# Patient Record
Sex: Female | Born: 1985 | Race: White | Hispanic: No | Marital: Married | Smoking: Never smoker
Health system: Southern US, Community
[De-identification: ages and names within clinical notes are randomized; demographics above are authoritative.]

## PROBLEM LIST (undated history)

## (undated) DIAGNOSIS — Z9109 Other allergy status, other than to drugs and biological substances: Secondary | ICD-10-CM

## (undated) DIAGNOSIS — F329 Major depressive disorder, single episode, unspecified: Secondary | ICD-10-CM

## (undated) DIAGNOSIS — R21 Rash and other nonspecific skin eruption: Secondary | ICD-10-CM

## (undated) DIAGNOSIS — M419 Scoliosis, unspecified: Secondary | ICD-10-CM

## (undated) DIAGNOSIS — G894 Chronic pain syndrome: Secondary | ICD-10-CM

## (undated) DIAGNOSIS — R002 Palpitations: Secondary | ICD-10-CM

## (undated) DIAGNOSIS — F410 Panic disorder [episodic paroxysmal anxiety] without agoraphobia: Secondary | ICD-10-CM

## (undated) DIAGNOSIS — F4310 Post-traumatic stress disorder, unspecified: Secondary | ICD-10-CM

## (undated) DIAGNOSIS — E349 Endocrine disorder, unspecified: Secondary | ICD-10-CM

## (undated) DIAGNOSIS — R5383 Other fatigue: Secondary | ICD-10-CM

## (undated) DIAGNOSIS — I491 Atrial premature depolarization: Secondary | ICD-10-CM

## (undated) DIAGNOSIS — F419 Anxiety disorder, unspecified: Secondary | ICD-10-CM

## (undated) DIAGNOSIS — E282 Polycystic ovarian syndrome: Secondary | ICD-10-CM

## (undated) DIAGNOSIS — F32A Depression, unspecified: Secondary | ICD-10-CM

## (undated) DIAGNOSIS — R1314 Dysphagia, pharyngoesophageal phase: Secondary | ICD-10-CM

## (undated) DIAGNOSIS — J45909 Unspecified asthma, uncomplicated: Secondary | ICD-10-CM

## (undated) DIAGNOSIS — F909 Attention-deficit hyperactivity disorder, unspecified type: Secondary | ICD-10-CM

## (undated) DIAGNOSIS — R42 Dizziness and giddiness: Secondary | ICD-10-CM

## (undated) HISTORY — DX: Anxiety disorder, unspecified: F41.9

## (undated) HISTORY — DX: Scoliosis, unspecified: M41.9

## (undated) HISTORY — DX: Palpitations: R00.2

## (undated) HISTORY — DX: Attention-deficit hyperactivity disorder, unspecified type: F90.9

## (undated) HISTORY — DX: Polycystic ovarian syndrome: E28.2

## (undated) HISTORY — DX: Dizziness and giddiness: R42

## (undated) HISTORY — DX: Unspecified asthma, uncomplicated: J45.909

## (undated) HISTORY — PX: TONSILLECTOMY AND ADENOIDECTOMY: SHX28

## (undated) HISTORY — DX: Rash and other nonspecific skin eruption: R21

## (undated) HISTORY — DX: Depression, unspecified: F32.A

## (undated) HISTORY — DX: Atrial premature depolarization: I49.1

## (undated) HISTORY — DX: Dysphagia, pharyngoesophageal phase: R13.14

## (undated) HISTORY — DX: Endocrine disorder, unspecified: E34.9

## (undated) HISTORY — DX: Post-traumatic stress disorder, unspecified: F43.10

## (undated) HISTORY — DX: Panic disorder (episodic paroxysmal anxiety): F41.0

## (undated) HISTORY — DX: Other fatigue: R53.83

## (undated) HISTORY — DX: Other allergy status, other than to drugs and biological substances: Z91.09

## (undated) HISTORY — DX: Chronic pain syndrome: G89.4

## (undated) HISTORY — DX: Major depressive disorder, single episode, unspecified: F32.9

---

## 2005-03-05 ENCOUNTER — Emergency Department (HOSPITAL_COMMUNITY): Admission: EM | Admit: 2005-03-05 | Discharge: 2005-03-05 | Payer: Self-pay | Admitting: Emergency Medicine

## 2007-03-14 ENCOUNTER — Ambulatory Visit: Payer: Self-pay | Admitting: Gastroenterology

## 2007-03-14 LAB — CONVERTED CEMR LAB
AST: 12 units/L (ref 0–37)
Albumin: 3.8 g/dL (ref 3.5–5.2)
BUN: 13 mg/dL (ref 6–23)
Basophils Relative: 0.7 % (ref 0.0–1.0)
Bilirubin, Direct: 0.1 mg/dL (ref 0.0–0.3)
CO2: 27 meq/L (ref 19–32)
Chloride: 109 meq/L (ref 96–112)
Creatinine, Ser: 0.8 mg/dL (ref 0.4–1.2)
Eosinophils Absolute: 0.4 10*3/uL (ref 0.0–0.6)
GFR calc Af Amer: 116 mL/min
GFR calc non Af Amer: 96 mL/min
Glucose, Bld: 89 mg/dL (ref 70–99)
HCT: 36.7 % (ref 36.0–46.0)
Lipase: 35 units/L (ref 11.0–59.0)
Neutro Abs: 3.2 10*3/uL (ref 1.4–7.7)
Potassium: 4.1 meq/L (ref 3.5–5.1)
RDW: 11.2 % — ABNORMAL LOW (ref 11.5–14.6)
Saturation Ratios: 15.1 % — ABNORMAL LOW (ref 20.0–50.0)
Sed Rate: 10 mm/hr (ref 0–25)
Sodium: 141 meq/L (ref 135–145)
TSH: 1.6 microintl units/mL (ref 0.35–5.50)
Total Protein: 7 g/dL (ref 6.0–8.3)
Transferrin: 308 mg/dL (ref >=212.0)
WBC: 7.7 10*3/uL (ref 4.5–10.5)

## 2007-03-24 ENCOUNTER — Ambulatory Visit (HOSPITAL_COMMUNITY): Admission: RE | Admit: 2007-03-24 | Discharge: 2007-03-24 | Payer: Self-pay | Admitting: Gastroenterology

## 2007-03-27 ENCOUNTER — Encounter: Payer: Self-pay | Admitting: Gastroenterology

## 2007-03-27 ENCOUNTER — Ambulatory Visit: Payer: Self-pay | Admitting: Gastroenterology

## 2007-04-27 ENCOUNTER — Ambulatory Visit: Payer: Self-pay | Admitting: Gastroenterology

## 2007-09-28 DIAGNOSIS — K219 Gastro-esophageal reflux disease without esophagitis: Secondary | ICD-10-CM

## 2007-09-28 DIAGNOSIS — K589 Irritable bowel syndrome without diarrhea: Secondary | ICD-10-CM

## 2007-09-28 DIAGNOSIS — J45909 Unspecified asthma, uncomplicated: Secondary | ICD-10-CM | POA: Insufficient documentation

## 2007-09-28 DIAGNOSIS — K449 Diaphragmatic hernia without obstruction or gangrene: Secondary | ICD-10-CM

## 2007-09-28 HISTORY — DX: Gastro-esophageal reflux disease without esophagitis: K21.9

## 2007-09-28 HISTORY — DX: Irritable bowel syndrome, unspecified: K58.9

## 2007-09-28 HISTORY — DX: Diaphragmatic hernia without obstruction or gangrene: K44.9

## 2008-04-19 IMAGING — US US ABDOMEN COMPLETE
1 series · 14 of 25 positions shown · non-contrast
Comparison: none

Clinic done: Abdominal pain

Ultrasound abdomen:
No previous for comparison.  Complete abdominal ultrasound examination was
performed including evaluation of the liver, gallbladder, bile ducts, pancreas,
kidneys, spleen, IVC, and abdominal aorta.
There is no evidence of gallstones or biliary ductal dilatation.  The liver is
within normal limits in echogenicity, and no focal liver lesions are seen.  The
visualized portions of the IVC and pancreas are unremarkable.
There is no evidence of splenomegaly.  The kidneys are unremarkable, and there
is no evidence of hydronephrosis.  The abdominal aorta is non-dilated.

[Series 1: unknown · 0.22mm/px · 14 of 57 slices shown]
[im 1/57]
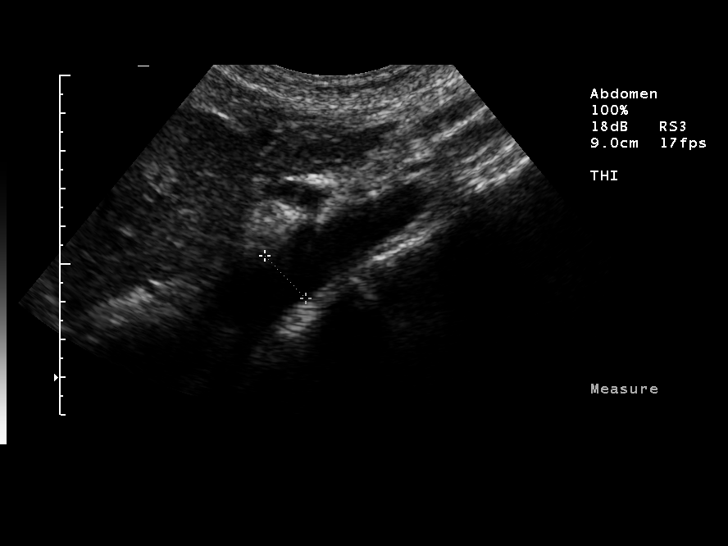
[im 5/57]
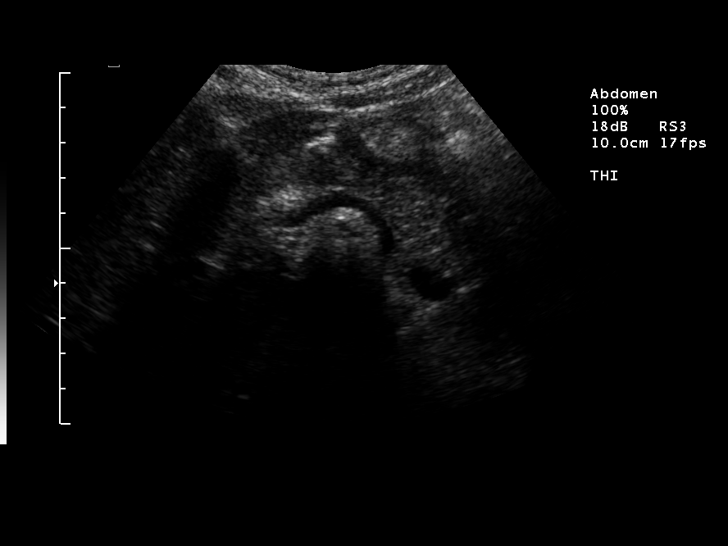
[im 10/57]
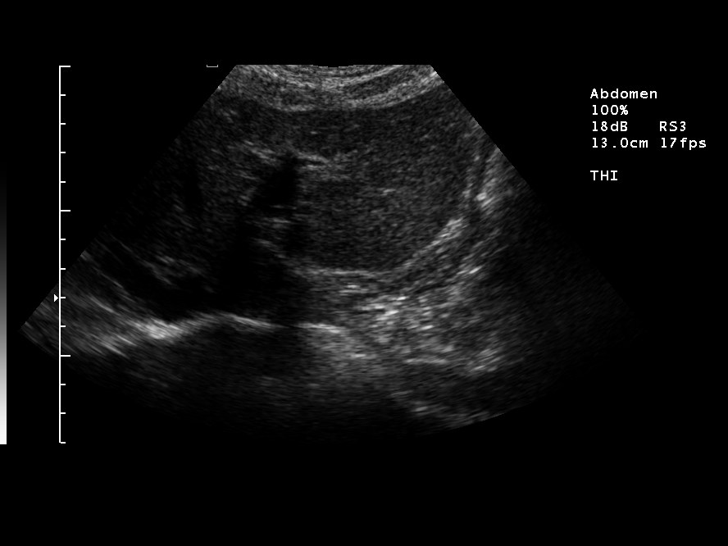
[im 15/57]
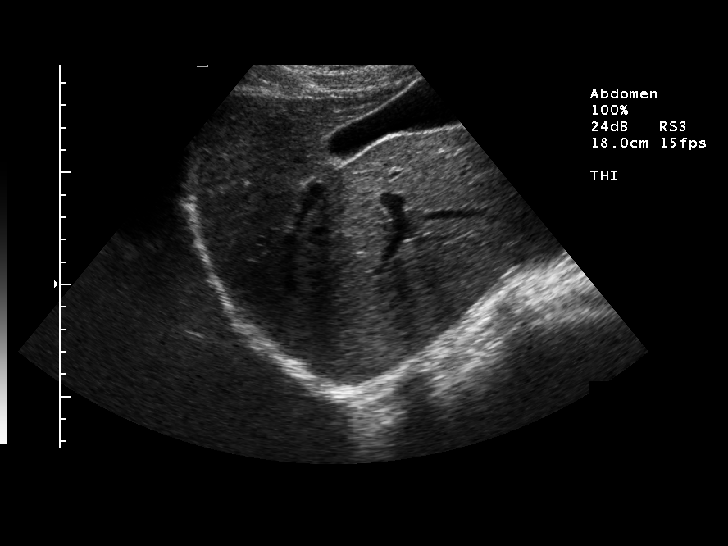
[im 19/57]
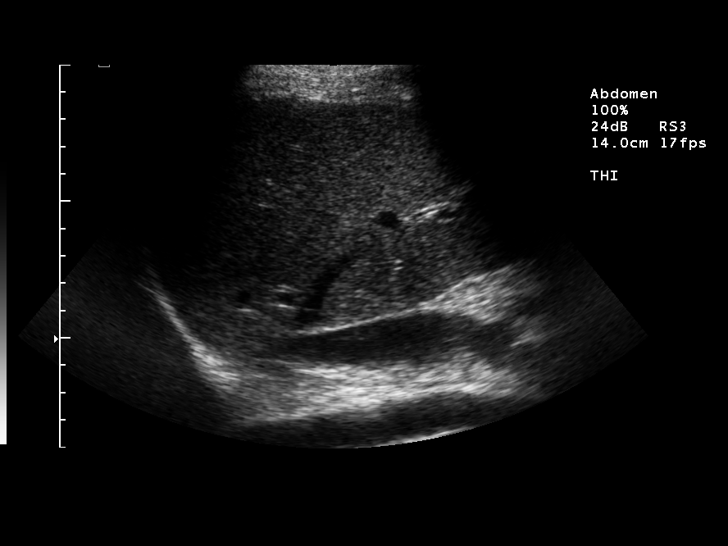
[im 22/57]
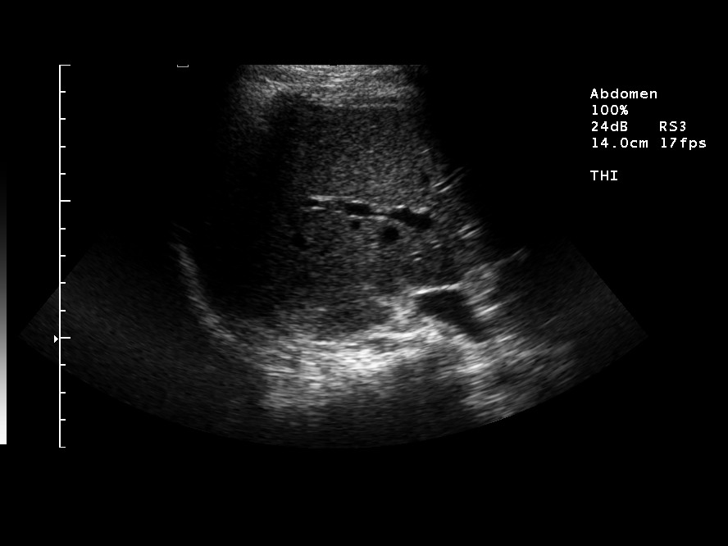
[im 26/57]
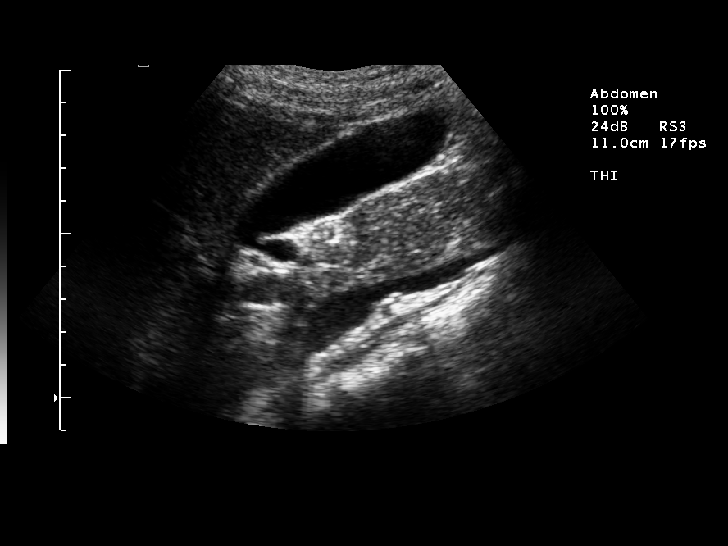
[im 31/57]
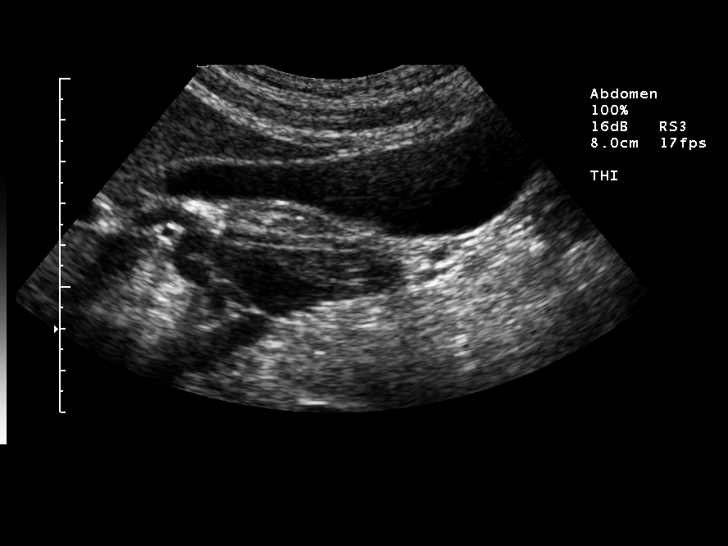
[im 36/57]
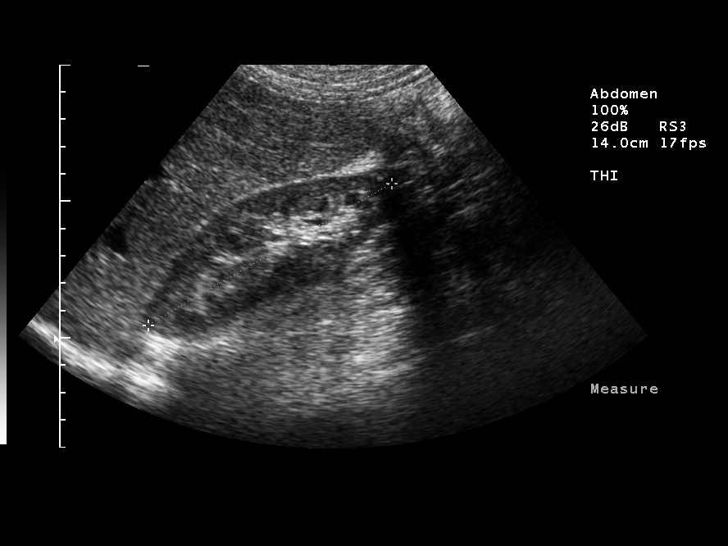
[im 38/57]
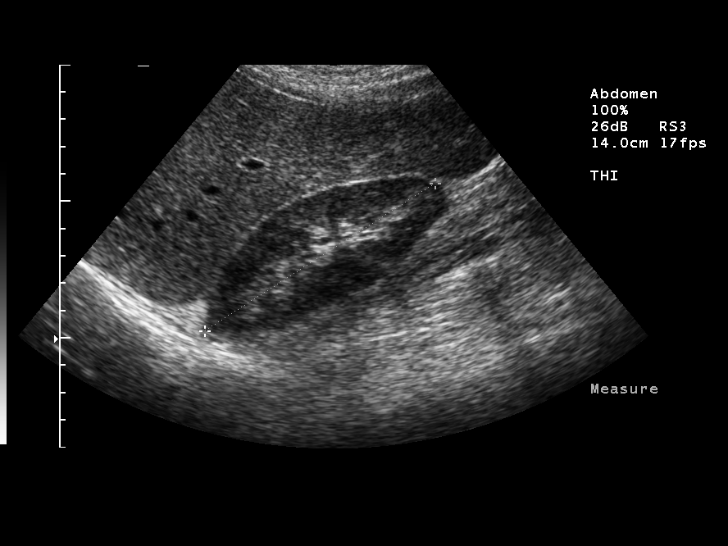
[im 43/57]
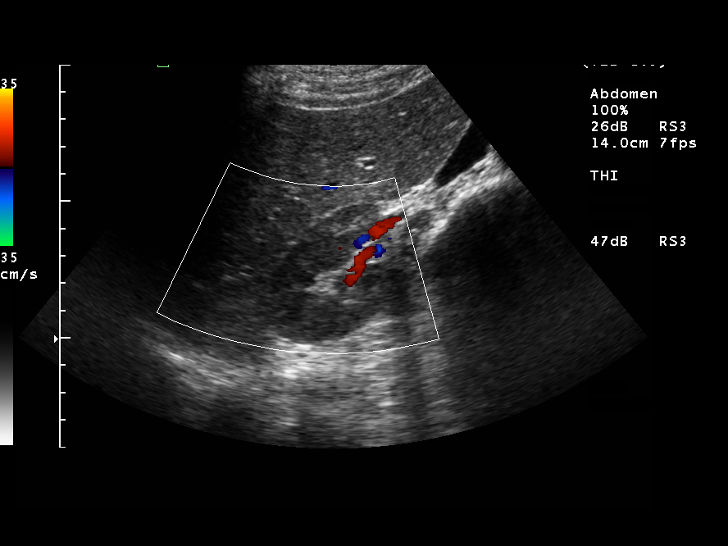
[im 47/57]
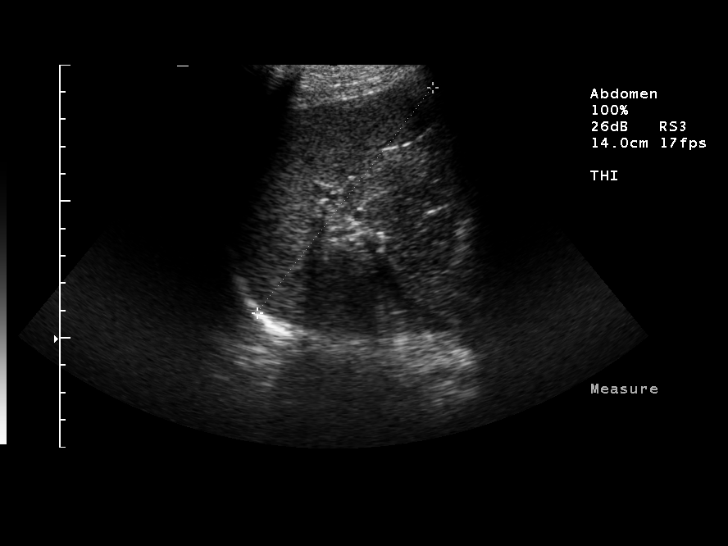
[im 52/57]
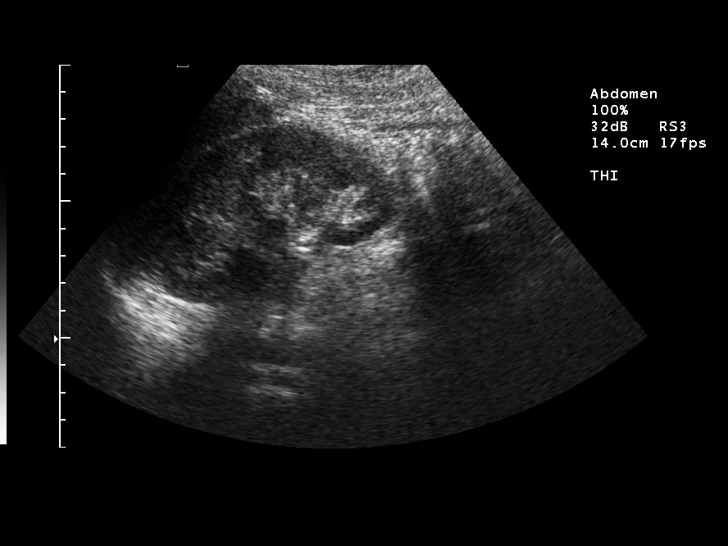
[im 57/57]
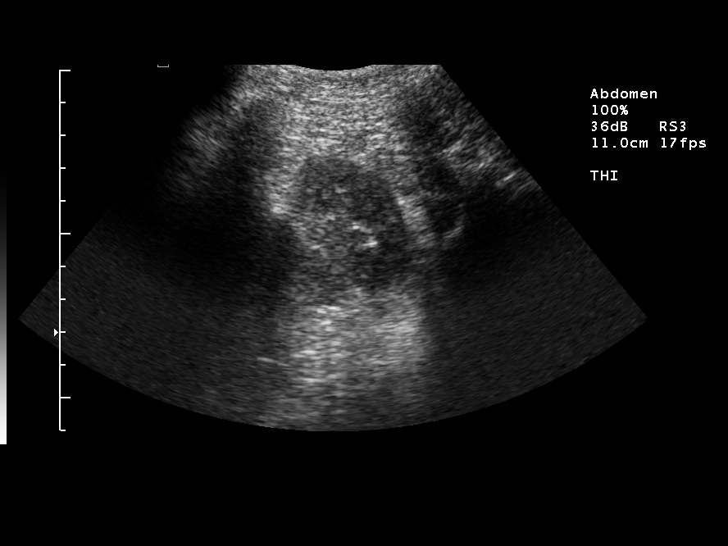

[14 of 25 positions shown; findings below may reference images not displayed]

IMPRESSION: 1.  Negative abdominal ultrasound.

## 2009-07-19 HISTORY — PX: WISDOM TOOTH EXTRACTION: SHX21

## 2010-12-01 NOTE — Assessment & Plan Note (Signed)
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   I have no previous records for review.  The patient apparently I have  had problems since age 86.  She had problems with hyperacidity and acid  reflux and was on frequent antacids until she turned 17, when she was  apparently placed on Nexium 1-2x a day by her physician.  She takes this  very erratically irregularly.  She continues to have burning epigastric  and substernal chest pain with acid reflux symptoms but no dysphasia.  She wakes up every morning and has emesis  of bilious material without  food products.  She has frequent diarrhea with abdominal cramping; and  said she had a burgundy-colored stool three weeks ago.  Her appetite is  good and she has had no weight loss.  She denies associated skin rashes,  joint pains, oral stomatitis, or visual difficulties.  She takes two  Advil a week, but denies NSAID abuse or abuse of alcohol or cigarettes.  She has never had  GI evaluations in the past.  She has traveled  extensively, but not to [place] countries.  She denies any specific  food intolerances, specifically, lactose and does not abuse sorbitol or  fructose.   PAST MEDICAL HISTORY:  Otherwise remarkable for asthmatic bronchitis  with a possible relationship to her GERD.  She uses PRN inhalers.  She  otherwise has no history of medical problems.   FAMILY HISTORY:  Remarkable for irritable bowel syndrome allegedly in  her father.   MEDICATIONS:  1. NuvaRing  2. Nexium twice a day.  3. Albuterol  inhaler.  4. Over-the-counter allergy medications.   ALLERGIES:  She denies drug allergies.   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  She  does not smoke, but uses ethanol socially.   REVIEW OF SYSTEMS:  Noncontributory without symptoms of Raynaud's  phenomenon, or other symptoms of collagen vascular disease.  She denies  cardiovascular, pulmonary, genitourinary, or gynecological problems.  She had her last menstrual period two days ago.   PHYSICAL EXAM:  She is an attractive, healthy, white female appearing  her stated age in no distress.  She is 5 feet 2 inches tall and weighs 132 pounds.  Blood pressures  124/64 and pulse was 60 and regular.  I could not appreciate stigmata of  chronic liver disease.  There was no thyromegaly or lymphadenopathy.  Chest was clear without wheezes or rhonchi.  She was in a regular rhythm without murmurs, gallops, or rubs.  I could not appreciate any hepatosplenomegaly, abdominal masses,  tenderness, or distention.  Bowel sounds were normal.  There was not a  succession splash in the epigastric area.  Mental  status was clear.  Peripheral extremities were unremarkable.  Inspection of the rectum was normal as was rectal exam without masses or  tenderness.  There was soft stool present.  It was guaiac-negative.   ASSESSMENT:  1. Chronic acid reflux disease with possible element of gastroparesis.  2. Rule out cholelithiasis.  3. Diarrhea, predominant irritable bowel syndrome rule out intestinal      parasites.   RECOMMENDATIONS:  1. Outpatient endoscopy and ultrasonography.  2. Screening laboratory parameters including celiac panel, amylase,      lipase, thyroid function tests, CBC, etcetera.  3. Reflux regimen explained to the patient.  4. Check stool for ova and parasites.  5. Ask the patient to take her Nexium 30 minutes before breakfast and      supper with Reglan 10 mg at bedtime.  6. Appropriate follow up after  her workup and procedures and dp reflux      regimen.     Vania Rea. Ann Motto, MD, Caleen Essex, FAGA  Electronically Signed    DRP/MedQ  DD: 03/14/2007  DT: 03/14/2007  Job #: [id number]

## 2010-12-01 NOTE — Assessment & Plan Note (Signed)
[facility]                         GASTROENTEROLOGY OFFICE NOTE   Ann, Christensen                   MRN:          [id number]  DATE:04/27/2007                            DOB:          [birth year]    Ann Christensen is asymptomatic on Nexium 40 mg twice a day along with the  Reglan 10 mg at bedtime.  She has had years of acid reflux and  regurgitation and is doing the best she has in many years.  Her  endoscopy on March 27, 2007, did show a hiatal hernia measuring 5 cm  in length with some erosive esophagitis.  There was no evidence of  Barrett's mucosa on esophageal biopsies.  CLOtest for H. pylori also was  negative.   I reviewed a reflux regime with Ann Christensen, and we will see her again in  3 months' time.  I reviewed the negative ultrasound exam that she had on  March 24, 2007, and also her multiple normal laboratory parameters.     Ann Christensen. Ann Motto, MD, Caleen Essex, FAGA  Electronically Signed    DRP/MedQ  DD: 04/27/2007  DT: 04/27/2007  Job #: [id number]

## 2014-12-23 LAB — HIV ANTIBODY (ROUTINE TESTING W REFLEX): HIV 1&2 Ab, 4th Generation: NONREACTIVE

## 2014-12-23 LAB — HM PAP SMEAR

## 2016-06-04 LAB — BASIC METABOLIC PANEL: Glucose: 95 mg/dL

## 2016-06-04 LAB — LIPID PANEL
Cholesterol: 168 mg/dL (ref 0–200)
HDL: 57 mg/dL (ref 35–70)
Triglycerides: 117 mg/dL (ref 40–160)

## 2016-08-09 ENCOUNTER — Encounter: Payer: Self-pay | Admitting: Adult Health

## 2016-08-09 ENCOUNTER — Ambulatory Visit (INDEPENDENT_AMBULATORY_CARE_PROVIDER_SITE_OTHER): Payer: BLUE CROSS/BLUE SHIELD | Admitting: Adult Health

## 2016-08-09 VITALS — BP 126/85 | HR 98 | Ht 65.0 in | Wt 161.5 lb

## 2016-08-09 DIAGNOSIS — L700 Acne vulgaris: Secondary | ICD-10-CM

## 2016-08-09 DIAGNOSIS — F909 Attention-deficit hyperactivity disorder, unspecified type: Secondary | ICD-10-CM | POA: Diagnosis not present

## 2016-08-09 DIAGNOSIS — G4700 Insomnia, unspecified: Secondary | ICD-10-CM

## 2016-08-09 DIAGNOSIS — M545 Low back pain: Secondary | ICD-10-CM

## 2016-08-09 DIAGNOSIS — F411 Generalized anxiety disorder: Secondary | ICD-10-CM

## 2016-08-09 DIAGNOSIS — G8929 Other chronic pain: Secondary | ICD-10-CM

## 2016-08-09 MED ORDER — SPIRONOLACTONE 100 MG PO TABS: 100.0000 mg | ORAL_TABLET | Freq: Every day | ORAL | 1 refills | Status: DC

## 2016-08-09 MED ORDER — BUPROPION HCL ER (XL) 300 MG PO TB24: 300.0000 mg | ORAL_TABLET | Freq: Every day | ORAL | 1 refills | Status: DC

## 2016-08-09 MED ORDER — AMPHETAMINE-DEXTROAMPHETAMINE 15 MG PO TABS: 15.0000 mg | ORAL_TABLET | Freq: Every day | ORAL | 0 refills | Status: DC

## 2016-08-09 MED ORDER — CLONIDINE HCL 0.1 MG PO TABS: 0.1000 mg | ORAL_TABLET | Freq: Every day | ORAL | 1 refills | Status: DC

## 2016-08-09 MED ORDER — AMPHETAMINE-DEXTROAMPHET ER 10 MG PO CP24: 10.0000 mg | ORAL_CAPSULE | Freq: Every day | ORAL | 0 refills | Status: DC

## 2016-08-09 MED ORDER — CYCLOBENZAPRINE HCL 10 MG PO TABS: 10.0000 mg | ORAL_TABLET | Freq: Three times a day (TID) | ORAL | 2 refills | Status: DC | PRN

## 2016-08-09 NOTE — Progress Notes (Signed)
   Subjective:    Patient ID: Ann Christensen, female    DOB: 16-Jan-1986, 31 y.o.   MRN: [id number]  HPI:  Ann Christensen presents to establish as new patient.  I have provided primary care to Ann Christensen for > 3 years at [facility].  Chronic medical conditions are well controlled with medication and she denies SE or non-compliance.  She continues monthly mental health visits and denies thoughts of harming herself or others.  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     Review of Systems  Constitutional: Negative for activity change, appetite change, diaphoresis, fatigue, fever and unexpected weight change.  HENT: Negative for congestion, sore throat and trouble swallowing.   Eyes: Negative for visual disturbance.  Respiratory: Negative for cough, chest tightness, shortness of breath and wheezing.   Cardiovascular: Negative for chest pain, palpitations and leg swelling.  Gastrointestinal: Negative for abdominal distention, constipation, diarrhea and nausea.  Endocrine: Negative for cold intolerance, heat intolerance, polydipsia, polyphagia and polyuria.  Genitourinary: Negative for difficulty urinating.  Musculoskeletal: Positive for back pain. Negative for myalgias.       Intermittent low back pain that is treated with Rx, rest, and heating pad.  Neurological: Negative for dizziness, speech difficulty and weakness.  Psychiatric/Behavioral: Positive for behavioral problems and sleep disturbance. Negative for agitation, confusion, hallucinations and suicidal ideas. The patient is hyperactive.       Objective:   Physical Exam  Constitutional: She is oriented to person, place, and time. She appears well-developed and well-nourished. No distress.  HENT:  Head: Normocephalic and atraumatic.  Right Ear: External ear normal.  Left Ear: External ear normal.  Eyes: Conjunctivae and EOM are normal. Pupils are equal, round, and  reactive to light.  Neck: Normal range of motion. Neck supple. No tracheal deviation present. No thyromegaly present.  Cardiovascular: Normal rate, regular rhythm, normal heart sounds and intact distal pulses.   Pulmonary/Chest: Effort normal and breath sounds normal. No respiratory distress. She has no wheezes. She has no rales. She exhibits no tenderness.  Abdominal: Soft. Bowel sounds are normal.  Musculoskeletal: Normal range of motion. She exhibits no edema or deformity.  Neurological: She is alert and oriented to person, place, and time. She has normal reflexes.  Skin: Skin is warm and dry. She is not diaphoretic.  Psychiatric: She has a normal mood and affect. Her behavior is normal. Judgment and thought content normal.  Nursing note and vitals reviewed.         Assessment & Plan:  1.  ADHD, 2 GAD-continue with medications and regular CBT. 2.  Insomnia-continue with current medications and practice good sleep hygiene. 4.  Acne-continue with medication and daily face washing. 5. Lumbago- Continue with medication and regular stretching.  Use good body mechanics when lifting, etc.  Rx's provided, take as directed. Continue at least monthly mental health encounters. Continue regular exercise and health eating. RTC in 4 weeks or sooner if needed. All questions/concerns answered.  Jariel Drost d. Beverlyn RouxBess, NP-C

## 2016-08-09 NOTE — Patient Instructions (Signed)
Seasonal Affective Disorder Introduction Seasonal affective disorder (SAD) is a form of depression. It is when you feel sad, down, or blue at specific times of the year. The most common time of year for this is late fall and winter, when the days are shorter and most people spend less time outdoors. This is why SAD is also known as the "winter blues." SAD occurs less commonly in the spring or summer. SAD can vary in severity and can interfere with work, school, relationships, and normal daily activities. What increases the risk? This condition is more common in:  [place] women.  People who have a history of clinical depression or bipolar disorder.  People who live far [place] or far [place] of the equator. What are the signs or symptoms? Symptoms of this condition include:  Depressed mood, such as:  Feeling sad, down, blue, or teary.  Having crying spells.  Irritability.  Trouble sleeping or sleeping more than usual.  Loss of interest in activities that you usually enjoy.  Feelings of guilt or worthlessness.  Restlessness or loss of energy.  Difficulty concentrating, remembering, or making decisions.  Significant change in appetite or weight.  Recurrent wishes for death, recurrent thoughts of self-harm, or an attempt at suicide. How is this diagnosed? Diagnosis of this condition is usually made through an assessment by your health care provider. You will be asked about your moods, thoughts, and behaviors. You will also be asked about your medical history, any major life changes, medicines, and substance use. A physical exam and lab work may be done to make sure there is no other cause for your depression. You may be referred to a mental health specialist for further evaluation. How is this treated? Treatment for this condition may include:  Light therapy. Light therapy involves sitting in front of a light source for 15-30 minutes every day. It is thought to work by increasing the  duration of daylight that is sensed by the brain.  Antidepressant medicine.  Cognitive behavioral therapy (CBT).CBT is a form of talk therapy that helps to identify and change negative thoughts that are associated with SAD. Follow these instructions at home:  Take over-the-counter and prescription medicines only as told by your healthcare provider. This is important.  Check with your health care provider before you start taking any new prescriptions, over-the-counter medicines, herbs, or supplements.  Keep all follow-up visits as told by your health care provider.This is important.  Maintain a healthy lifestyle.  Eat healthy foods.  Get plenty of sleep.  Exercise regularly.  Do not drink alcohol.  Do not use recreational drugs.  Make your home and work environment as sunny or bright as possible. Open blinds and spend as much time as possible outside. Contact a health care provider if:  Your medicines do not seem to be helping.  Your symptoms do not improve or they get worse.  You have trouble taking care of yourself.  You experience side effects of medicines, such as changes in sleep patterns, dizziness, drowsiness, weight gain, restlessness, movement changes, or tremors. Get help right away if:  You have serious thoughts about hurting yourself or others.  You experience serious side effects of medicine, such as:  Swelling of your face, lips, tongue, or throat.  Fever, confusion, muscle spasms, or seizures. This information is not intended to replace advice given to you by your health care provider. Make sure you discuss any questions you have with your health care provider. Document Released: 03/30/2001 Document Revised: 12/11/2015  Document Reviewed: 07/09/2014  2017 Elsevier  Generalized Anxiety Disorder Generalized anxiety disorder (GAD) is a mental disorder. It interferes with life functions, including relationships, work, and school. GAD is different from  normal anxiety, which everyone experiences at some point in their lives in response to specific life events and activities. Normal anxiety actually helps us prepare for and get through these life events and activities. Normal anxiety goes away after the event or activity is over.  GAD causes anxiety that is not necessarily related to specific events or activities. It also causes excess anxiety in proportion to specific events or activities. The anxiety associated with GAD is also difficult to control. GAD can vary from mild to severe. People with severe GAD can have intense waves of anxiety with physical symptoms (panic attacks).  SYMPTOMS The anxiety and worry associated with GAD are difficult to control. This anxiety and worry are related to many life events and activities and also occur more days than not for 6 months or longer. People with GAD also have three or more of the following symptoms (one or more in children):  Restlessness.   Fatigue.  Difficulty concentrating.   Irritability.  Muscle tension.  Difficulty sleeping or unsatisfying sleep. DIAGNOSIS GAD is diagnosed through an assessment by your health care provider. Your health care provider will ask you questions aboutyour mood,physical symptoms, and events in your life. Your health care provider may ask you about your medical history and use of alcohol or drugs, including prescription medicines. Your health care provider may also do a physical exam and blood tests. Certain medical conditions and the use of certain substances can cause symptoms similar to those associated with GAD. Your health care provider may refer you to a mental health specialist for further evaluation. TREATMENT The following therapies are usually used to treat GAD:   Medication. Antidepressant medication usually is prescribed for long-term daily control. Antianxiety medicines may be added in severe cases, especially when panic attacks occur.   Talk  therapy (psychotherapy). Certain types of talk therapy can be helpful in treating GAD by providing support, education, and guidance. A form of talk therapy called cognitive behavioral therapy can teach you healthy ways to think about and react to daily life events and activities.  Stress managementtechniques. These include yoga, meditation, and exercise and can be very helpful when they are practiced regularly. A mental health specialist can help determine which treatment is best for you. Some people see improvement with one therapy. However, other people require a combination of therapies. This information is not intended to replace advice given to you by your health care provider. Make sure you discuss any questions you have with your health care provider. Document Released: 10/30/2012 Document Revised: 07/26/2014 Document Reviewed: 10/30/2012 Elsevier Interactive Patient Education  2017 [employer]  Please return for monthly follow-ups and as needed. Continue with mental health. Continue with healthy eating and regular exercise.

## 2016-09-07 ENCOUNTER — Other Ambulatory Visit: Payer: Self-pay | Admitting: Adult Health

## 2016-09-09 ENCOUNTER — Encounter: Payer: Self-pay | Admitting: Adult Health

## 2016-09-09 ENCOUNTER — Ambulatory Visit (INDEPENDENT_AMBULATORY_CARE_PROVIDER_SITE_OTHER): Payer: BLUE CROSS/BLUE SHIELD | Admitting: Adult Health

## 2016-09-09 VITALS — BP 123/87 | HR 79 | Ht 65.0 in | Wt 159.0 lb

## 2016-09-09 DIAGNOSIS — Z79899 Other long term (current) drug therapy: Secondary | ICD-10-CM

## 2016-09-09 DIAGNOSIS — G4700 Insomnia, unspecified: Secondary | ICD-10-CM | POA: Diagnosis not present

## 2016-09-09 DIAGNOSIS — F419 Anxiety disorder, unspecified: Secondary | ICD-10-CM

## 2016-09-09 DIAGNOSIS — F909 Attention-deficit hyperactivity disorder, unspecified type: Secondary | ICD-10-CM | POA: Diagnosis not present

## 2016-09-09 DIAGNOSIS — F339 Major depressive disorder, recurrent, unspecified: Secondary | ICD-10-CM

## 2016-09-09 HISTORY — DX: Major depressive disorder, recurrent, unspecified: F33.9

## 2016-09-09 HISTORY — DX: Insomnia, unspecified: G47.00

## 2016-09-09 HISTORY — DX: Attention-deficit hyperactivity disorder, unspecified type: F90.9

## 2016-09-09 MED ORDER — AMPHETAMINE-DEXTROAMPHET ER 10 MG PO CP24: 10.0000 mg | ORAL_CAPSULE | Freq: Every day | ORAL | 0 refills | Status: DC

## 2016-09-09 MED ORDER — AMPHETAMINE-DEXTROAMPHETAMINE 15 MG PO TABS: 15.0000 mg | ORAL_TABLET | Freq: Every day | ORAL | 0 refills | Status: DC

## 2016-09-09 MED ORDER — NORETHINDRONE ACET-ETHINYL EST 1-20 MG-MCG(24) PO CHEW: 1.0000 | CHEWABLE_TABLET | Freq: Every day | ORAL | 0 refills | Status: DC

## 2016-09-09 MED ORDER — NORETHINDRONE ACET-ETHINYL EST 1-20 MG-MCG(24) PO CHEW: 1.0000 | CHEWABLE_TABLET | Freq: Every day | ORAL | 11 refills | Status: DC

## 2016-09-09 NOTE — Assessment & Plan Note (Signed)
Practice good sleep hygiene. Do not use caffeine after 2pm daily.

## 2016-09-09 NOTE — Patient Instructions (Signed)
Heart-Healthy Eating Plan °Many factors influence your heart health, including eating and exercise habits. Heart (coronary) risk increases with abnormal blood fat (lipid) levels. Heart-healthy meal planning includes limiting unhealthy fats, increasing healthy fats, and making other small dietary changes. This includes maintaining a healthy body weight to help keep lipid levels within a normal range. °What is my plan? °Your health care provider recommends that you: °· Get no more than _________% of the total calories in your daily diet from fat. °· Limit your intake of saturated fat to less than _________% of your total calories each day. °· Limit the amount of cholesterol in your diet to less than _________ mg per day. ° °What types of fat should I choose? °· Choose healthy fats more often. Choose monounsaturated and polyunsaturated fats, such as olive oil and canola oil, flaxseeds, walnuts, almonds, and seeds. °· Eat more omega-3 fats. Good choices include salmon, mackerel, sardines, tuna, flaxseed oil, and ground flaxseeds. Aim to eat fish at least two times each week. °· Limit saturated fats. Saturated fats are primarily found in animal products, such as meats, butter, and cream. Plant sources of saturated fats include palm oil, palm kernel oil, and coconut oil. °· Avoid foods with partially hydrogenated oils in them. These contain trans fats. Examples of foods that contain trans fats are stick margarine, some tub margarines, cookies, crackers, and other baked goods. °What general guidelines do I need to follow? °· Check food labels carefully to identify foods with trans fats or high amounts of saturated fat. °· Fill one half of your plate with vegetables and green salads. Eat 4-5 servings of vegetables per day. A serving of vegetables equals 1 cup of raw leafy vegetables, ½ cup of raw or cooked cut-up vegetables, or ½ cup of vegetable juice. °· Fill one fourth of your plate with whole grains. Look for the word  "whole" as the first word in the ingredient list. °· Fill one fourth of your plate with lean protein foods. °· Eat 4-5 servings of fruit per day. A serving of fruit equals one medium whole fruit, ¼ cup of dried fruit, ½ cup of fresh, frozen, or canned fruit, or ½ cup of 100% fruit juice. °· Eat more foods that contain soluble fiber. Examples of foods that contain this type of fiber are apples, broccoli, carrots, beans, peas, and barley. Aim to get 20-30 g of fiber per day. °· Eat more home-cooked food and less restaurant, buffet, and fast food. °· Limit or avoid alcohol. °· Limit foods that are high in starch and sugar. °· Avoid fried foods. °· Cook foods by using methods other than frying. Baking, boiling, grilling, and broiling are all great options. Other fat-reducing suggestions include: °? Removing the skin from poultry. °? Removing all visible fats from meats. °? Skimming the fat off of stews, soups, and gravies before serving them. °? Steaming vegetables in water or broth. °· Lose weight if you are overweight. Losing just 5-10% of your initial body weight can help your overall health and prevent diseases such as diabetes and heart disease. °· Increase your consumption of nuts, legumes, and seeds to 4-5 servings per week. One serving of dried beans or legumes equals ½ cup after being cooked, one serving of nuts equals 1½ ounces, and one serving of seeds equals ½ ounce or 1 tablespoon. °· You may need to monitor your salt (sodium) intake, especially if you have high blood pressure. Talk with your health care provider or dietitian to get   more information about reducing sodium. °What foods can I eat? °Grains ° °Breads, including French, white, pita, wheat, raisin, rye, oatmeal, and Italian. Tortillas that are neither fried nor made with lard or trans fat. Low-fat rolls, including hotdog and hamburger buns and English muffins. Biscuits. Muffins. Waffles. Pancakes. Light popcorn. Whole-grain cereals. Flatbread.  Melba toast. Pretzels. Breadsticks. Rusks. Low-fat snacks and crackers, including oyster, saltine, matzo, graham, animal, and rye. Rice and pasta, including brown rice and those that are made with whole wheat. °Vegetables °All vegetables. °Fruits °All fruits, but limit coconut. °Meats and Other Protein Sources °Lean, well-trimmed beef, veal, pork, and lamb. Chicken and turkey without skin. All fish and shellfish. Wild duck, rabbit, pheasant, and venison. Egg whites or low-cholesterol egg substitutes. Dried beans, peas, lentils, and tofu. Seeds and most nuts. °Dairy °Low-fat or nonfat cheeses, including ricotta, string, and mozzarella. Skim or 1% milk that is liquid, powdered, or evaporated. Buttermilk that is made with low-fat milk. Nonfat or low-fat yogurt. °Beverages °Mineral water. Diet carbonated beverages. °Sweets and Desserts °Sherbets and fruit ices. Honey, jam, marmalade, jelly, and syrups. Meringues and gelatins. Pure sugar candy, such as hard candy, jelly beans, gumdrops, mints, marshmallows, and small amounts of dark chocolate. Angel food cake. °Eat all sweets and desserts in moderation. °Fats and Oils °Nonhydrogenated (trans-free) margarines. Vegetable oils, including soybean, sesame, sunflower, olive, peanut, safflower, corn, canola, and cottonseed. Salad dressings or mayonnaise that are made with a vegetable oil. Limit added fats and oils that you use for cooking, baking, salads, and as spreads. °Other °Cocoa powder. Coffee and tea. All seasonings and condiments. °The items listed above may not be a complete list of recommended foods or beverages. Contact your dietitian for more options. °What foods are not recommended? °Grains °Breads that are made with saturated or trans fats, oils, or whole milk. Croissants. Butter rolls. Cheese breads. Sweet rolls. Donuts. Buttered popcorn. Chow mein noodles. High-fat crackers, such as cheese or butter crackers. °Meats and Other Protein Sources °Fatty meats, such  as hotdogs, short ribs, sausage, spareribs, bacon, ribeye roast or steak, and mutton. High-fat deli meats, such as salami and bologna. Caviar. Domestic duck and goose. Organ meats, such as kidney, liver, sweetbreads, brains, gizzard, chitterlings, and heart. °Dairy °Cream, sour cream, cream cheese, and creamed cottage cheese. Whole milk cheeses, including blue (bleu), Monterey Jack, Brie, Colby, American, Havarti, Swiss, cheddar, Camembert, and [place]. Whole or 2% milk that is liquid, evaporated, or condensed. Whole buttermilk. Cream sauce or high-fat cheese sauce. Yogurt that is made from whole milk. °Beverages °Regular sodas and drinks with added sugar. °Sweets and Desserts °Frosting. Pudding. Cookies. Cakes other than angel food cake. Candy that has milk chocolate or white chocolate, hydrogenated fat, butter, coconut, or unknown ingredients. Buttered syrups. Full-fat ice cream or ice cream drinks. °Fats and Oils °Gravy that has suet, meat fat, or shortening. Cocoa butter, hydrogenated oils, palm oil, coconut oil, palm kernel oil. These can often be found in baked products, candy, fried foods, nondairy creamers, and whipped toppings. Solid fats and shortenings, including bacon fat, salt pork, lard, and butter. Nondairy cream substitutes, such as coffee creamers and sour cream substitutes. Salad dressings that are made of unknown oils, cheese, or sour cream. °The items listed above may not be a complete list of foods and beverages to avoid. Contact your dietitian for more information. °This information is not intended to replace advice given to you by your health care provider. Make sure you discuss any questions you have with your health care   provider. °Document Released: 04/13/2008 Document Revised: 01/23/2016 Document Reviewed: 12/27/2013 °Elsevier Interactive Patient Education © 2017 [employer] ° °

## 2016-09-09 NOTE — Progress Notes (Signed)
Subjective:    Patient ID: CADY HAFEN, female    DOB: 12/11/1985, 31 y.o.   MRN: [id number]  HPI:  Ms. Landstrom presents for regular f/u-ADHA, anxiety, depression, GERD, and insomnia.  She is very compliant on her medication, denies SE.  She continues to see her therapist monthly and denies thoughts of harming herself/others.  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  She reports family/friend relationships all going well and is going in her Honeymoon this weekend-[place].   Patient Care Team    Relationship Specialty Notifications Start End  Malon Kindle, NP PCP - General Family Medicine  08/09/16     Patient Active Problem List   Diagnosis Date Noted  . ADHD (attention deficit hyperactivity disorder) 09/09/2016  . Insomnia 09/09/2016  . Depression, recurrent (HCC) 09/09/2016  . Anxiety 09/09/2016  . ASTHMA 09/28/2007  . GERD 09/28/2007  . HIATAL HERNIA 09/28/2007  . IRRITABLE BOWEL SYNDROME 09/28/2007     Past Medical History:  Diagnosis Date  . Anxiety   . Depression      Past Surgical History:  Procedure Laterality Date  . TONSILLECTOMY AND ADENOIDECTOMY     She was 31 years old  . WISDOM TOOTH EXTRACTION  2011     Family History  Problem Relation Age of Onset  . Hypertension Mother   . Alcohol abuse Father   . Hypertension Father   . Cancer Maternal Aunt     colon  . Cancer Maternal Grandmother     colon  . Diabetes Paternal Grandmother      History  Drug Use  . Frequency: 10.0 times per week  . Types: Marijuana     History  Alcohol Use  . Yes    Comment: Rare     History  Smoking Status  . Never Smoker  Smokeless Tobacco  . Never Used     Outpatient Encounter Prescriptions as of 09/09/2016  Medication Sig  . [START ON 11/06/2016] amphetamine-dextroamphetamine (ADDERALL XR) 10 MG 24 hr capsule Take 1 capsule (10 mg total) by mouth daily.  [name] ON 11/06/2016] amphetamine-dextroamphetamine (ADDERALL) 15 MG  tablet Take 1 tablet by mouth daily. Take at 2pm.  . buPROPion (WELLBUTRIN XL) 300 MG 24 hr tablet Take 1 tablet (300 mg total) by mouth daily.  . cloNIDine (CATAPRES) 0.1 MG tablet Take 1 tablet (0.1 mg total) by mouth daily. PRN  . cyclobenzaprine (FLEXERIL) 10 MG tablet Take 1 tablet (10 mg total) by mouth 3 (three) times daily as needed for muscle spasms.  . Multiple Vitamin (MULTIVITAMIN) tablet Take 1 tablet by mouth daily.  [name] spironolactone (ALDACTONE) 100 MG tablet Take 1 tablet (100 mg total) by mouth daily.  . [DISCONTINUED] amphetamine-dextroamphetamine (ADDERALL XR) 10 MG 24 hr capsule Take 1 capsule (10 mg total) by mouth daily.  . [DISCONTINUED] amphetamine-dextroamphetamine (ADDERALL XR) 10 MG 24 hr capsule Take 1 capsule (10 mg total) by mouth daily.  . [DISCONTINUED] amphetamine-dextroamphetamine (ADDERALL XR) 10 MG 24 hr capsule Take 1 capsule (10 mg total) by mouth daily.  . [DISCONTINUED] amphetamine-dextroamphetamine (ADDERALL) 15 MG tablet Take 1 tablet by mouth daily. Take at 2pm.  . [DISCONTINUED] amphetamine-dextroamphetamine (ADDERALL) 15 MG tablet Take 1 tablet by mouth daily. Take at 2pm.  . [DISCONTINUED] amphetamine-dextroamphetamine (ADDERALL) 15 MG tablet Take 1 tablet by mouth daily. Take at 2pm.  . Norethin Ace-Eth Estrad-FE (NORETHINDRONE ACET-ETHINYL EST) 1-20 MG-MCG(24) CHEW Chew 1 tablet by mouth daily.  . [DISCONTINUED] Norethin Ace-Eth Estrad-FE (  NORETHINDRONE ACET-ETHINYL EST) 1-20 MG-MCG(24) CHEW Chew 1 tablet by mouth daily.   No facility-administered encounter medications on file as of 09/09/2016.     Allergies: Latex  Body mass index is 26.46 kg/m.  Blood pressure 123/87, pulse 79, height 5\' 5"  (1.651 m), weight 159 lb (72.1 kg).    Review of Systems  Constitutional: Positive for fatigue. Negative for activity change, appetite change, chills, diaphoresis, fever and unexpected weight change.  HENT: Negative for congestion.   Eyes: Negative for  visual disturbance.  Respiratory: Negative for cough and shortness of breath.   Cardiovascular: Negative for chest pain, palpitations and leg swelling.  Musculoskeletal: Positive for arthralgias and myalgias.  Neurological: Negative for dizziness, tremors, weakness, light-headedness, numbness and headaches.  Psychiatric/Behavioral: Positive for decreased concentration and sleep disturbance. Negative for agitation, behavioral problems, confusion, dysphoric mood, hallucinations, self-injury and suicidal ideas. The patient is nervous/anxious and is hyperactive.        Objective:   Physical Exam  Constitutional: She is oriented to person, place, and time. She appears well-developed and well-nourished. No distress.  Cardiovascular: Normal rate, regular rhythm, normal heart sounds and intact distal pulses.   No murmur heard. Pulmonary/Chest: Effort normal and breath sounds normal. No respiratory distress. She has no wheezes. She has no rales. She exhibits no tenderness.  Abdominal: Soft. Bowel sounds are normal. She exhibits no distension and no mass. There is no tenderness. There is no rebound and no guarding.  Musculoskeletal: Normal range of motion.  Neurological: She is alert and oriented to person, place, and time. She has normal reflexes.  Skin: Skin is warm and dry. No rash noted. She is not diaphoretic. No erythema. No pallor.  Psychiatric: She has a normal mood and affect. Her behavior is normal. Judgment and thought content normal.  Nursing note and vitals reviewed.         Assessment & Plan:   1. Encounter for long-term (current) use of high-risk medication   2. Attention deficit hyperactivity disorder (ADHD), unspecified ADHD type   3. Insomnia, unspecified type   4. Depression, recurrent (HCC)   5. Anxiety     ADHD (attention deficit hyperactivity disorder) Aderrall Rx provided. Please continue with monthly CBT.  Insomnia Practice good sleep hygiene. Do not use  caffeine after 2pm daily.   Depression, recurrent (HCC) Please continue Bupropion 300mg  daily. Continue with monthly CBT.   Anxiety Please continue Bupropion 300mg  daily. Continue with monthly CBT.     FOLLOW-UP:  Return in about 3 months (around 12/07/2016) for Regular Follow Up, Evaluate Medication Effectiveness.

## 2016-09-09 NOTE — Assessment & Plan Note (Signed)
Please continue Bupropion 300mg daily. Continue with monthly CBT.  

## 2016-09-09 NOTE — Assessment & Plan Note (Signed)
Please continue Bupropion 300mg  daily. Continue with monthly CBT.

## 2016-09-09 NOTE — Assessment & Plan Note (Signed)
Aderrall Rx provided. Please continue with monthly CBT.

## 2016-09-10 LAB — COMPREHENSIVE METABOLIC PANEL
A/G RATIO: 1.6 (ref 1.2–2.2)
ALT: 17 IU/L (ref 0–32)
AST: 18 IU/L (ref 0–40)
Albumin: 4.4 g/dL (ref 3.5–5.5)
Alkaline Phosphatase: 49 IU/L (ref 39–117)
BUN/Creatinine Ratio: 10 (ref 9–23)
BUN: 11 mg/dL (ref 6–20)
Bilirubin Total: 0.5 mg/dL (ref 0.0–1.2)
CALCIUM: 9.9 mg/dL (ref 8.7–10.2)
CO2: 23 mmol/L (ref 18–29)
Chloride: 101 mmol/L (ref 96–106)
Creatinine, Ser: 1.1 mg/dL — ABNORMAL HIGH (ref 0.57–1.00)
GFR, EST AFRICAN AMERICAN: 77 (ref >59)
GFR, EST NON AFRICAN AMERICAN: 67 (ref >59)
GLOBULIN, TOTAL: 2.7 (ref 1.5–4.5)
Glucose: 81 mg/dL (ref 65–99)
POTASSIUM: 4.9 mmol/L (ref 3.5–5.2)
SODIUM: 140 mmol/L (ref 134–144)
TOTAL PROTEIN: 7.1 g/dL (ref 6.0–8.5)

## 2016-10-04 ENCOUNTER — Telehealth: Payer: Self-pay | Admitting: Adult Health

## 2016-10-04 MED ORDER — NORETHINDRONE ACET-ETHINYL EST 1-20 MG-MCG(24) PO CHEW: 1.0000 | CHEWABLE_TABLET | Freq: Every day | ORAL | 0 refills | Status: DC

## 2016-10-04 NOTE — Addendum Note (Signed)
Addended by: Stan HeadNELSON, TONYA S on: 10/04/2016 11:03 AM   Modules accepted: Orders

## 2016-10-04 NOTE — Telephone Encounter (Signed)
Advised pt that her RX for birth control pills was printed on the same paper as her Adderall RX.  Pt stated that she had forgotten about this.  She will send RX to [facility], but she needed an RX sent to local pharmacy since she is completely out.  RX sent to [facility].  Ann Christensen. Blease Capaldi, CMA

## 2016-10-04 NOTE — Telephone Encounter (Signed)
Patient was told on last visit that she would receive a 3 mnth supply of her birth control meds through [facility] but has yet to receive anything and now is out.

## 2016-10-07 ENCOUNTER — Telehealth: Payer: Self-pay | Admitting: Adult Health

## 2016-10-07 NOTE — Telephone Encounter (Signed)
Patient states that she needs a new Rx for her birth control sent to [facility] for a 3 mnth supply and wants a call to confirm when done

## 2016-10-07 NOTE — Telephone Encounter (Signed)
Informed pt that [facility] refills for 1 year.  Advised pt that if [facility] did not receive fax to please call back and we will refax.  Pt expressed understanding and is agreeable.  Tiajuana Amass. Nelson, CMA

## 2016-10-08 ENCOUNTER — Other Ambulatory Visit: Payer: Self-pay

## 2016-10-08 MED ORDER — NORETHINDRONE ACET-ETHINYL EST 1-20 MG-MCG(24) PO CHEW: 1.0000 | CHEWABLE_TABLET | Freq: Every day | ORAL | 3 refills | Status: DC

## 2016-10-11 ENCOUNTER — Other Ambulatory Visit: Payer: Self-pay

## 2016-10-13 ENCOUNTER — Other Ambulatory Visit: Payer: Self-pay

## 2016-10-13 MED ORDER — CLONIDINE HCL 0.1 MG PO TABS: 0.1000 mg | ORAL_TABLET | Freq: Every day | ORAL | 0 refills | Status: DC

## 2016-10-13 MED ORDER — BUPROPION HCL ER (XL) 300 MG PO TB24: 300.0000 mg | ORAL_TABLET | Freq: Every day | ORAL | 0 refills | Status: DC

## 2016-11-21 ENCOUNTER — Other Ambulatory Visit: Payer: Self-pay | Admitting: Adult Health

## 2016-11-25 ENCOUNTER — Ambulatory Visit: Payer: BLUE CROSS/BLUE SHIELD | Admitting: Adult Health

## 2016-12-08 ENCOUNTER — Ambulatory Visit (INDEPENDENT_AMBULATORY_CARE_PROVIDER_SITE_OTHER): Payer: BLUE CROSS/BLUE SHIELD | Admitting: Adult Health

## 2016-12-08 ENCOUNTER — Encounter: Payer: Self-pay | Admitting: Adult Health

## 2016-12-08 DIAGNOSIS — Z0000 Encounter for general adult medical examination without abnormal findings: Secondary | ICD-10-CM | POA: Diagnosis not present

## 2016-12-08 HISTORY — DX: Encounter for general adult medical examination without abnormal findings: Z00.00

## 2016-12-08 MED ORDER — AMPHETAMINE-DEXTROAMPHET ER 10 MG PO CP24: 10.0000 mg | ORAL_CAPSULE | Freq: Every day | ORAL | 0 refills | Status: DC

## 2016-12-08 MED ORDER — AMPHETAMINE-DEXTROAMPHETAMINE 15 MG PO TABS: 15.0000 mg | ORAL_TABLET | Freq: Every day | ORAL | 0 refills | Status: DC

## 2016-12-08 MED ORDER — CYCLOBENZAPRINE HCL 10 MG PO TABS: 10.0000 mg | ORAL_TABLET | Freq: Three times a day (TID) | ORAL | 2 refills | Status: DC | PRN

## 2016-12-08 NOTE — Assessment & Plan Note (Signed)
Increase water intake and continue eat a well balanced diet. Continue medications as directed. Recommend instituting an "Admin" day in your work week. Continue with therapy, increase frequency as needed. Suggest the book "Solve For Happy". Follow up in 3 months, sooner if needed.

## 2016-12-08 NOTE — Progress Notes (Signed)
Subjective:    Patient ID: Ann Christensen, female    DOB: [birth year], 31 y.o.   MRN: [id number]  HPI:  Ann Christensen presents for f/u-Anxiety, depression, ADHD, SAD, and insomnia.  She reports medication compliance and denies SE.  She continues CBT at least monthly and denies tobacco/ETOH use.  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  Her service dog "Jacquenette Shone" passed away two weeks ago and she is struggling with this loss-UNDERSTANDABLE.   She eats a well balanced diet, however still struggles with water intake (drinks pop, gatorae instead).  She is sleeping better, esp with her "weighted blanket".  Patient Care Team    Relationship Specialty Notifications Start End  Malon Kindle, NP PCP - General Family Medicine  08/09/16     Patient Active Problem List   Diagnosis Date Noted  . Health care maintenance 12/08/2016  . ADHD (attention deficit hyperactivity disorder) 09/09/2016  . Insomnia 09/09/2016  . Depression, recurrent (HCC) 09/09/2016  . Anxiety 09/09/2016  . ASTHMA 09/28/2007  . GERD 09/28/2007  . HIATAL HERNIA 09/28/2007  . IRRITABLE BOWEL SYNDROME 09/28/2007     Past Medical History:  Diagnosis Date  . Anxiety   . Depression      Past Surgical History:  Procedure Laterality Date  . TONSILLECTOMY AND ADENOIDECTOMY     She was 31 years old  . WISDOM TOOTH EXTRACTION  2011     Family History  Problem Relation Age of Onset  . Hypertension Mother   . Alcohol abuse Father   . Hypertension Father   . Cancer Maternal Aunt        colon  . Cancer Maternal Grandmother        colon  . Diabetes Paternal Grandmother      History  Drug Use  . Frequency: 10.0 times per week  . Types: Marijuana     History  Alcohol Use  . Yes    Comment: Rare     History  Smoking Status  . Never Smoker  Smokeless Tobacco  . Never Used     Outpatient Encounter Prescriptions as of 12/08/2016  Medication Sig  . [START  ON 02/06/2017] amphetamine-dextroamphetamine (ADDERALL XR) 10 MG 24 hr capsule Take 1 capsule (10 mg total) by mouth daily.  [name] ON 02/06/2017] amphetamine-dextroamphetamine (ADDERALL) 15 MG tablet Take 1 tablet by mouth daily. Take at 2pm.  . buPROPion (WELLBUTRIN XL) 300 MG 24 hr tablet Take 1 tablet (300 mg total) by mouth daily.  . cloNIDine (CATAPRES) 0.1 MG tablet Take 1 tablet (0.1 mg total) by mouth daily. PRN  . cyclobenzaprine (FLEXERIL) 10 MG tablet Take 1 tablet (10 mg total) by mouth 3 (three) times daily as needed for muscle spasms.  . Multiple Vitamin (MULTIVITAMIN) tablet Take 1 tablet by mouth daily.  . Norethin Ace-Eth Estrad-FE (NORETHINDRONE ACET-ETHINYL EST) 1-20 MG-MCG(24) CHEW Chew 1 tablet by mouth daily.  [name] spironolactone (ALDACTONE) 100 MG tablet Take 1 tablet (100 mg total) by mouth daily.  . [DISCONTINUED] amphetamine-dextroamphetamine (ADDERALL XR) 10 MG 24 hr capsule Take 1 capsule (10 mg total) by mouth daily.  . [DISCONTINUED] amphetamine-dextroamphetamine (ADDERALL XR) 10 MG 24 hr capsule Take 1 capsule (10 mg total) by mouth daily.  . [DISCONTINUED] amphetamine-dextroamphetamine (ADDERALL XR) 10 MG 24 hr capsule Take 1 capsule (10 mg total) by mouth daily.  . [DISCONTINUED] amphetamine-dextroamphetamine (ADDERALL) 15 MG tablet Take 1 tablet by mouth daily. Take at  2pm.  . [DISCONTINUED] amphetamine-dextroamphetamine (ADDERALL) 15 MG tablet Take 1 tablet by mouth daily. Take at 2pm.  . [DISCONTINUED] amphetamine-dextroamphetamine (ADDERALL) 15 MG tablet Take 1 tablet by mouth daily. Take at 2pm.  . [DISCONTINUED] cyclobenzaprine (FLEXERIL) 10 MG tablet Take 1 tablet (10 mg total) by mouth 3 (three) times daily as needed for muscle spasms.   No facility-administered encounter medications on file as of 12/08/2016.     Allergies: Latex  Body mass index is 24.89 kg/m.  Blood pressure 110/74, pulse 84, height 5\' 5"  (1.651 m), weight 149 lb 9.6 oz (67.9 kg), last  menstrual period 11/17/2016.     Review of Systems  Constitutional: Positive for fatigue. Negative for activity change, appetite change, chills, diaphoresis, fever and unexpected weight change.  Respiratory: Negative for cough, chest tightness, shortness of breath, wheezing and stridor.   Cardiovascular: Negative for chest pain, palpitations and leg swelling.  Gastrointestinal: Negative for abdominal distention, abdominal pain, blood in stool, constipation, diarrhea, nausea and vomiting.  Endocrine: Negative for cold intolerance, heat intolerance, polydipsia, polyphagia and polyuria.  Genitourinary: Negative for difficulty urinating, flank pain and hematuria.  Musculoskeletal: Positive for arthralgias and myalgias. Negative for back pain, gait problem and joint swelling.  Skin: Negative for color change, pallor, rash and wound.  Neurological: Negative for dizziness, tremors, weakness and headaches.  Hematological: Bruises/bleeds easily.  Psychiatric/Behavioral: Positive for decreased concentration, dysphoric mood and sleep disturbance. Negative for agitation, self-injury and suicidal ideas. The patient is nervous/anxious and is hyperactive.        Objective:   Physical Exam  Constitutional: She is oriented to person, place, and time. She appears well-developed and well-nourished. No distress.  Eyes: Conjunctivae are normal. Pupils are equal, round, and reactive to light.  Neck: Normal range of motion. Neck supple.  Cardiovascular: Normal rate, regular rhythm, normal heart sounds and intact distal pulses.   No murmur heard. Pulmonary/Chest: Effort normal and breath sounds normal. No respiratory distress. She has no wheezes. She has no rales. She exhibits no tenderness.  Lymphadenopathy:    She has no cervical adenopathy.  Neurological: She is alert and oriented to person, place, and time. Coordination normal.  Skin: Skin is warm and dry. No rash noted. She is not diaphoretic. No  erythema. No pallor.  Psychiatric: She has a normal mood and affect. Her behavior is normal. Judgment and thought content normal.  Nursing note and vitals reviewed.         Assessment & Plan:   1. Health care maintenance     Health care maintenance Increase water intake and continue eat a well balanced diet. Continue medications as directed. Recommend instituting an "Admin" day in your work week. Continue with therapy, increase frequency as needed. Suggest the book "Solve For Happy". Follow up in 3 months, sooner if needed.    FOLLOW-UP:  Return in about 3 months (around 03/10/2017) for Regular Follow Up.

## 2016-12-08 NOTE — Patient Instructions (Signed)
Generalized Anxiety Disorder, Adult Generalized anxiety disorder (GAD) is a mental health disorder. People with this condition constantly worry about everyday events. Unlike normal anxiety, worry related to GAD is not triggered by a specific event. These worries also do not fade or get better with time. GAD interferes with life functions, including relationships, work, and school. GAD can vary from mild to severe. People with severe GAD can have intense waves of anxiety with physical symptoms (panic attacks). What are the causes? The exact cause of GAD is not known. What increases the risk? This condition is more likely to develop in:  Women.  People who have a family history of anxiety disorders.  People who are very shy.  People who experience very stressful life events, such as the death of a loved one.  People who have a very stressful family environment. What are the signs or symptoms? People with GAD often worry excessively about many things in their lives, such as their health and family. They may also be overly concerned about:  Doing well at work.  Being on time.  Natural disasters.  Friendships. Physical symptoms of GAD include:  Fatigue.  Muscle tension or having muscle twitches.  Trembling or feeling shaky.  Being easily startled.  Feeling like your heart is pounding or racing.  Feeling out of breath or like you cannot take a deep breath.  Having trouble falling asleep or staying asleep.  Sweating.  Nausea, diarrhea, or irritable bowel syndrome (IBS).  Headaches.  Trouble concentrating or remembering facts.  Restlessness.  Irritability. How is this diagnosed? Your health care provider can diagnose GAD based on your symptoms and medical history. You will also have a physical exam. The health care provider will ask specific questions about your symptoms, including how severe they are, when they started, and if they come and go. Your health care  provider may ask you about your use of alcohol or drugs, including prescription medicines. Your health care provider may refer you to a mental health specialist for further evaluation. Your health care provider will do a thorough examination and may perform additional tests to rule out other possible causes of your symptoms. To be diagnosed with GAD, a person must have anxiety that:  Is out of his or her control.  Affects several different aspects of his or her life, such as work and relationships.  Causes distress that makes him or her unable to take part in normal activities.  Includes at least three physical symptoms of GAD, such as restlessness, fatigue, trouble concentrating, irritability, muscle tension, or sleep problems. Before your health care provider can confirm a diagnosis of GAD, these symptoms must be present more days than they are not, and they must last for six months or longer. How is this treated? The following therapies are usually used to treat GAD:  Medicine. Antidepressant medicine is usually prescribed for long-term daily control. Antianxiety medicines may be added in severe cases, especially when panic attacks occur.  Talk therapy (psychotherapy). Certain types of talk therapy can be helpful in treating GAD by providing support, education, and guidance. Options include:  Cognitive behavioral therapy (CBT). People learn coping skills and techniques to ease their anxiety. They learn to identify unrealistic or negative thoughts and behaviors and to replace them with positive ones.  Acceptance and commitment therapy (ACT). This treatment teaches people how to be mindful as a way to cope with unwanted thoughts and feelings.  Biofeedback. This process trains you to manage your body's response (  physiological response) through breathing techniques and relaxation methods. You will work with a therapist while machines are used to monitor your physical symptoms.  Stress  management techniques. These include yoga, meditation, and exercise. A mental health specialist can help determine which treatment is best for you. Some people see improvement with one type of therapy. However, other people require a combination of therapies. Follow these instructions at home:  Take over-the-counter and prescription medicines only as told by your health care provider.  Try to maintain a normal routine.  Try to anticipate stressful situations and allow extra time to manage them.  Practice any stress management or self-calming techniques as taught by your health care provider.  Do not punish yourself for setbacks or for not making progress.  Try to recognize your accomplishments, even if they are small.  Keep all follow-up visits as told by your health care provider. This is important. Contact a health care provider if:  Your symptoms do not get better.  Your symptoms get worse.  You have signs of depression, such as:  A persistently sad, cranky, or irritable mood.  Loss of enjoyment in activities that used to bring you joy.  Change in weight or eating.  Changes in sleeping habits.  Avoiding friends or family members.  Loss of energy for normal tasks.  Feelings of guilt or worthlessness. Get help right away if:  You have serious thoughts about hurting yourself or others. If you ever feel like you may hurt yourself or others, or have thoughts about taking your own life, get help right away. You can go to your nearest emergency department or call:  Your local emergency services (911 in the [place]  A suicide crisis helpline, such as the National Suicide Prevention Lifeline at 636-683-04821-445-567-6415. This is open 24 hours a day. Summary  Generalized anxiety disorder (GAD) is a mental health disorder that involves worry that is not triggered by a specific event.  People with GAD often worry excessively about many things in their lives, such as their health and  family.  GAD may cause physical symptoms such as restlessness, trouble concentrating, sleep problems, frequent sweating, nausea, diarrhea, headaches, and trembling or muscle twitching.  A mental health specialist can help determine which treatment is best for you. Some people see improvement with one type of therapy. However, other people require a combination of therapies. This information is not intended to replace advice given to you by your health care provider. Make sure you discuss any questions you have with your health care provider. Document Released: 10/30/2012 Document Revised: 05/25/2016 Document Reviewed: 05/25/2016 Elsevier Interactive Patient Education  2017 [employer]  Increase water intake and continue eat a well balanced diet. Continue medications as directed. Recommend instituting an "Admin" day in your work week. Continue with therapy, increase frequency as needed. Suggest the book "[employer]". Follow up in 3 months, sooner if needed.

## 2016-12-09 ENCOUNTER — Other Ambulatory Visit: Payer: Self-pay | Admitting: Adult Health

## 2016-12-10 ENCOUNTER — Other Ambulatory Visit: Payer: Self-pay | Admitting: Adult Health

## 2017-02-21 ENCOUNTER — Other Ambulatory Visit: Payer: Self-pay | Admitting: Adult Health

## 2017-02-21 MED ORDER — CYCLOBENZAPRINE HCL 10 MG PO TABS: ORAL_TABLET | ORAL | 2 refills | Status: DC

## 2017-02-21 NOTE — Telephone Encounter (Signed)
Please refill, thanks

## 2017-02-21 NOTE — Telephone Encounter (Signed)
Please review and authorize refill if appropriate.  T. Sabella Traore, CMA 

## 2017-03-14 ENCOUNTER — Ambulatory Visit (INDEPENDENT_AMBULATORY_CARE_PROVIDER_SITE_OTHER): Payer: BLUE CROSS/BLUE SHIELD | Admitting: Adult Health

## 2017-03-14 ENCOUNTER — Encounter: Payer: Self-pay | Admitting: Adult Health

## 2017-03-14 DIAGNOSIS — F909 Attention-deficit hyperactivity disorder, unspecified type: Secondary | ICD-10-CM

## 2017-03-14 DIAGNOSIS — F339 Major depressive disorder, recurrent, unspecified: Secondary | ICD-10-CM

## 2017-03-14 DIAGNOSIS — Z0000 Encounter for general adult medical examination without abnormal findings: Secondary | ICD-10-CM | POA: Diagnosis not present

## 2017-03-14 MED ORDER — AMPHETAMINE-DEXTROAMPHETAMINE 15 MG PO TABS: 15.0000 mg | ORAL_TABLET | Freq: Every day | ORAL | 0 refills | Status: DC

## 2017-03-14 MED ORDER — AMPHETAMINE-DEXTROAMPHET ER 10 MG PO CP24: 10.0000 mg | ORAL_CAPSULE | Freq: Every day | ORAL | 0 refills | Status: DC

## 2017-03-14 NOTE — Assessment & Plan Note (Signed)
Sx's controlled on Adderall 15mg  and Adderall XR 10mg . Advised to see therapist at least every 2 months. She denies medication SE.

## 2017-03-14 NOTE — Assessment & Plan Note (Signed)
Strive to drink at least 75 ounces water/day. Follow heart healthy diet. Stretch/meditate prior to bedtime. Recommend to see therapist at least once every 2 months. CPE in 3 months.

## 2017-03-14 NOTE — Patient Instructions (Signed)
Generalized Anxiety Disorder, Adult Generalized anxiety disorder (GAD) is a mental health disorder. People with this condition constantly worry about everyday events. Unlike normal anxiety, worry related to GAD is not triggered by a specific event. These worries also do not fade or get better with time. GAD interferes with life functions, including relationships, work, and school. GAD can vary from mild to severe. People with severe GAD can have intense waves of anxiety with physical symptoms (panic attacks). What are the causes? The exact cause of GAD is not known. What increases the risk? This condition is more likely to develop in:  Women.  People who have a family history of anxiety disorders.  People who are very shy.  People who experience very stressful life events, such as the death of a loved one.  People who have a very stressful family environment.  What are the signs or symptoms? People with GAD often worry excessively about many things in their lives, such as their health and family. They may also be overly concerned about:  Doing well at work.  Being on time.  Natural disasters.  Friendships.  Physical symptoms of GAD include:  Fatigue.  Muscle tension or having muscle twitches.  Trembling or feeling shaky.  Being easily startled.  Feeling like your heart is pounding or racing.  Feeling out of breath or like you cannot take a deep breath.  Having trouble falling asleep or staying asleep.  Sweating.  Nausea, diarrhea, or irritable bowel syndrome (IBS).  Headaches.  Trouble concentrating or remembering facts.  Restlessness.  Irritability.  How is this diagnosed? Your health care provider can diagnose GAD based on your symptoms and medical history. You will also have a physical exam. The health care provider will ask specific questions about your symptoms, including how severe they are, when they started, and if they come and go. Your health care  provider may ask you about your use of alcohol or drugs, including prescription medicines. Your health care provider may refer you to a mental health specialist for further evaluation. Your health care provider will do a thorough examination and may perform additional tests to rule out other possible causes of your symptoms. To be diagnosed with GAD, a person must have anxiety that:  Is out of his or her control.  Affects several different aspects of his or her life, such as work and relationships.  Causes distress that makes him or her unable to take part in normal activities.  Includes at least three physical symptoms of GAD, such as restlessness, fatigue, trouble concentrating, irritability, muscle tension, or sleep problems.  Before your health care provider can confirm a diagnosis of GAD, these symptoms must be present more days than they are not, and they must last for six months or longer. How is this treated? The following therapies are usually used to treat GAD:  Medicine. Antidepressant medicine is usually prescribed for long-term daily control. Antianxiety medicines may be added in severe cases, especially when panic attacks occur.  Talk therapy (psychotherapy). Certain types of talk therapy can be helpful in treating GAD by providing support, education, and guidance. Options include: ? Cognitive behavioral therapy (CBT). People learn coping skills and techniques to ease their anxiety. They learn to identify unrealistic or negative thoughts and behaviors and to replace them with positive ones. ? Acceptance and commitment therapy (ACT). This treatment teaches people how to be mindful as a way to cope with unwanted thoughts and feelings. ? Biofeedback. This process trains you to   manage your body's response (physiological response) through breathing techniques and relaxation methods. You will work with a therapist while machines are used to monitor your physical symptoms.  Stress  management techniques. These include yoga, meditation, and exercise.  A mental health specialist can help determine which treatment is best for you. Some people see improvement with one type of therapy. However, other people require a combination of therapies. Follow these instructions at home:  Take over-the-counter and prescription medicines only as told by your health care provider.  Try to maintain a normal routine.  Try to anticipate stressful situations and allow extra time to manage them.  Practice any stress management or self-calming techniques as taught by your health care provider.  Do not punish yourself for setbacks or for not making progress.  Try to recognize your accomplishments, even if they are small.  Keep all follow-up visits as told by your health care provider. This is important. Contact a health care provider if:  Your symptoms do not get better.  Your symptoms get worse.  You have signs of depression, such as: ? A persistently sad, cranky, or irritable mood. ? Loss of enjoyment in activities that used to bring you joy. ? Change in weight or eating. ? Changes in sleeping habits. ? Avoiding friends or family members. ? Loss of energy for normal tasks. ? Feelings of guilt or worthlessness. Get help right away if:  You have serious thoughts about hurting yourself or others. If you ever feel like you may hurt yourself or others, or have thoughts about taking your own life, get help right away. You can go to your nearest emergency department or call:  Your local emergency services (911 in the [place]  A suicide crisis helpline, such as the National Suicide Prevention Lifeline at (289)560-9475. This is open 24 hours a day.  Summary  Generalized anxiety disorder (GAD) is a mental health disorder that involves worry that is not triggered by a specific event.  People with GAD often worry excessively about many things in their lives, such as their health and  family.  GAD may cause physical symptoms such as restlessness, trouble concentrating, sleep problems, frequent sweating, nausea, diarrhea, headaches, and trembling or muscle twitching.  A mental health specialist can help determine which treatment is best for you. Some people see improvement with one type of therapy. However, other people require a combination of therapies. This information is not intended to replace advice given to you by your health care provider. Make sure you discuss any questions you have with your health care provider. Document Released: 10/30/2012 Document Revised: 05/25/2016 Document Reviewed: 05/25/2016 Elsevier Interactive Patient Education  2018 [employer].    Heart-Healthy Eating Plan Many factors influence your heart health, including eating and exercise habits. Heart (coronary) risk increases with abnormal blood fat (lipid) levels. Heart-healthy meal planning includes limiting unhealthy fats, increasing healthy fats, and making other small dietary changes. This includes maintaining a healthy body weight to help keep lipid levels within a normal range. What is my plan? Your health care provider recommends that you:  Get no more than __25__% of the total calories in your daily diet from fat.  Limit your intake of saturated fat to less than __5___% of your total calories each day.  Limit the amount of cholesterol in your diet to less than __300__ mg per day.  What types of fat should I choose?  Choose healthy fats more often. Choose monounsaturated and polyunsaturated fats, such as olive oil  and canola oil, flaxseeds, walnuts, almonds, and seeds.  Eat more omega-3 fats. Good choices include salmon, mackerel, sardines, tuna, flaxseed oil, and ground flaxseeds. Aim to eat fish at least two times each week.  Limit saturated fats. Saturated fats are primarily found in animal products, such as meats, butter, and cream. Plant sources of saturated fats include palm  oil, palm kernel oil, and coconut oil.  Avoid foods with partially hydrogenated oils in them. These contain trans fats. Examples of foods that contain trans fats are stick margarine, some tub margarines, cookies, crackers, and other baked goods. What general guidelines do I need to follow?  Check food labels carefully to identify foods with trans fats or high amounts of saturated fat.  Fill one half of your plate with vegetables and green salads. Eat 4-5 servings of vegetables per day. A serving of vegetables equals 1 cup of raw leafy vegetables,  cup of raw or cooked cut-up vegetables, or  cup of vegetable juice.  Fill one fourth of your plate with whole grains. Look for the word "whole" as the first word in the ingredient list.  Fill one fourth of your plate with lean protein foods.  Eat 4-5 servings of fruit per day. A serving of fruit equals one medium whole fruit,  cup of dried fruit,  cup of fresh, frozen, or canned fruit, or  cup of 100% fruit juice.  Eat more foods that contain soluble fiber. Examples of foods that contain this type of fiber are apples, broccoli, carrots, beans, peas, and barley. Aim to get 20-30 g of fiber per day.  Eat more home-cooked food and less restaurant, buffet, and fast food.  Limit or avoid alcohol.  Limit foods that are high in starch and sugar.  Avoid fried foods.  Cook foods by using methods other than frying. Baking, boiling, grilling, and broiling are all great options. Other fat-reducing suggestions include: ? Removing the skin from poultry. ? Removing all visible fats from meats. ? Skimming the fat off of stews, soups, and gravies before serving them. ? Steaming vegetables in water or broth.  Lose weight if you are overweight. Losing just 5-10% of your initial body weight can help your overall health and prevent diseases such as diabetes and heart disease.  Increase your consumption of nuts, legumes, and seeds to 4-5 servings per week.  One serving of dried beans or legumes equals  cup after being cooked, one serving of nuts equals 1 ounces, and one serving of seeds equals  ounce or 1 tablespoon.  You may need to monitor your salt (sodium) intake, especially if you have high blood pressure. Talk with your health care provider or dietitian to get more information about reducing sodium. What foods can I eat? Grains  Breads, including [place], white, pita, wheat, raisin, rye, oatmeal, and [place]. Tortillas that are neither fried nor made with lard or trans fat. Low-fat rolls, including hotdog and hamburger buns and English muffins. Biscuits. Muffins. Waffles. Pancakes. Light popcorn. Whole-grain cereals. Flatbread. Melba toast. Pretzels. Breadsticks. Rusks. Low-fat snacks and crackers, including oyster, saltine, matzo, graham, animal, and rye. Rice and pasta, including brown rice and those that are made with whole wheat. Vegetables All vegetables. Fruits All fruits, but limit coconut. Meats and Other Protein Sources Lean, well-trimmed beef, veal, pork, and lamb. Chicken and [place] without skin. All fish and shellfish. Wild duck, rabbit, pheasant, and venison. Egg whites or low-cholesterol egg substitutes. Dried beans, peas, lentils, and tofu.Seeds and most nuts. Dairy Low-fat  or nonfat cheeses, including ricotta, string, and mozzarella. Skim or 1% milk that is liquid, powdered, or evaporated. Buttermilk that is made with low-fat milk. Nonfat or low-fat yogurt. Beverages Mineral water. Diet carbonated beverages. Sweets and Desserts Sherbets and fruit ices. Honey, jam, marmalade, jelly, and syrups. Meringues and gelatins. Pure sugar candy, such as hard candy, jelly beans, gumdrops, mints, marshmallows, and small amounts of dark chocolate. [employer]. Eat all sweets and desserts in moderation. Fats and Oils Nonhydrogenated (trans-free) margarines. Vegetable oils, including soybean, sesame, sunflower, olive, peanut,  safflower, corn, canola, and cottonseed. Salad dressings or mayonnaise that are made with a vegetable oil. Limit added fats and oils that you use for cooking, baking, salads, and as spreads. Other Cocoa powder. Coffee and tea. All seasonings and condiments. The items listed above may not be a complete list of recommended foods or beverages. Contact your dietitian for more options. What foods are not recommended? Grains Breads that are made with saturated or trans fats, oils, or whole milk. Croissants. Butter rolls. Cheese breads. Sweet rolls. Donuts. Buttered popcorn. Chow mein noodles. High-fat crackers, such as cheese or butter crackers. Meats and Other Protein Sources Fatty meats, such as hotdogs, short ribs, sausage, spareribs, bacon, ribeye roast or steak, and mutton. High-fat deli meats, such as salami and bologna. Caviar. Domestic duck and goose. Organ meats, such as kidney, liver, sweetbreads, brains, gizzard, chitterlings, and heart. Dairy Cream, sour cream, cream cheese, and creamed cottage cheese. Whole milk cheeses, including blue (bleu), [address], [place], [place], [address], [place], [address], [place], [place], and [place]. Whole or 2% milk that is liquid, evaporated, or condensed. Whole buttermilk. Cream sauce or high-fat cheese sauce. Yogurt that is made from whole milk. Beverages Regular sodas and drinks with added sugar. Sweets and Desserts Frosting. Pudding. Cookies. Cakes other than angel food cake. Candy that has milk chocolate or white chocolate, hydrogenated fat, butter, coconut, or unknown ingredients. Buttered syrups. Full-fat ice cream or ice cream drinks. Fats and Oils Gravy that has suet, meat fat, or shortening. Cocoa butter, hydrogenated oils, palm oil, coconut oil, palm kernel oil. These can often be found in baked products, candy, fried foods, nondairy creamers, and whipped toppings. Solid fats and shortenings, including bacon fat, salt pork, lard, and butter. Nondairy  cream substitutes, such as coffee creamers and sour cream substitutes. Salad dressings that are made of unknown oils, cheese, or sour cream. The items listed above may not be a complete list of foods and beverages to avoid. Contact your dietitian for more information. This information is not intended to replace advice given to you by your health care provider. Make sure you discuss any questions you have with your health care provider. Document Released: 04/13/2008 Document Revised: 01/23/2016 Document Reviewed: 12/27/2013 Elsevier Interactive Patient Education  2017 [employer].   Please continue all medications as directed. Increase water intake, strive for at least 70 ounces/day. Continue with Chiropractor and stretch nightly. Recommend seeing your therapist at least once very 2 months. Please schedule complete physical in 3 months.

## 2017-03-14 NOTE — Progress Notes (Signed)
Subjective:    Patient ID: Ann Christensen, female    DOB: [birth year], 31 y.o.   MRN: [id number]  HPI:  [name] is here for regular f/u: ADHD, anxiety, depression, GED, insomnia, and chronic/diffuse muscle soreness/spasm. She is compliant on all medications and denies SE.  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  She has been trying to increase water intake and eats "whatever I really want b/c I move all day".  She continues to have muscle soreness/aches and visits her chiropractor at least monthly.  She cannot recall her last therapy appt, however reports that her anxiety/depression have been stable.  She continues to avoid tobacco/EOTH use.  Patient Care Team    Relationship Specialty Notifications Start End  William Hamburger D, NP PCP - General Family Medicine  08/09/16   Noland Fordyce, MD Consulting Physician Obstetrics and Gynecology  03/14/17     Patient Active Problem List   Diagnosis Date Noted  . Health care maintenance 12/08/2016  . ADHD (attention deficit hyperactivity disorder) 09/09/2016  . Insomnia 09/09/2016  . Depression, recurrent (HCC) 09/09/2016  . Anxiety 09/09/2016  . ASTHMA 09/28/2007  . GERD 09/28/2007  . HIATAL HERNIA 09/28/2007  . IRRITABLE BOWEL SYNDROME 09/28/2007     Past Medical History:  Diagnosis Date  . Anxiety   . Depression      Past Surgical History:  Procedure Laterality Date  . TONSILLECTOMY AND ADENOIDECTOMY     She was 31 years old  . WISDOM TOOTH EXTRACTION  2011     Family History  Problem Relation Age of Onset  . Hypertension Mother   . Alcohol abuse Father   . Hypertension Father   . Cancer Maternal Aunt        colon  . Cancer Maternal Grandmother        colon  . Diabetes Paternal Grandmother      History  Drug Use  . Frequency: 10.0 times per week  . Types: Marijuana     History  Alcohol Use  . Yes    Comment: Rare     History  Smoking Status  . Never Smoker  Smokeless Tobacco  .  Never Used     Outpatient Encounter Prescriptions as of 03/14/2017  Medication Sig  . [START ON 05/11/2017] amphetamine-dextroamphetamine (ADDERALL XR) 10 MG 24 hr capsule Take 1 capsule (10 mg total) by mouth daily.  [name] ON 05/11/2017] amphetamine-dextroamphetamine (ADDERALL) 15 MG tablet Take 1 tablet by mouth daily. Take at 2pm.  . buPROPion (WELLBUTRIN XL) 300 MG 24 hr tablet TAKE 1 TABLET BY MOUTH  DAILY  . cloNIDine (CATAPRES) 0.1 MG tablet TAKE 1 TABLET BY MOUTH  DAILY AS NEEDED  . cyclobenzaprine (FLEXERIL) 10 MG tablet TAKE 1 TABLET BY MOUTH 3  TIMES DAILY AS NEEDED FOR  MUSCLE SPASM(S)  . Multiple Vitamin (MULTIVITAMIN) tablet Take 1 tablet by mouth daily.  . Norethin Ace-Eth Estrad-FE (NORETHINDRONE ACET-ETHINYL EST) 1-20 MG-MCG(24) CHEW Chew 1 tablet by mouth daily.  [name] spironolactone (ALDACTONE) 100 MG tablet Take 1 tablet (100 mg total) by mouth daily.  . [DISCONTINUED] amphetamine-dextroamphetamine (ADDERALL XR) 10 MG 24 hr capsule Take 1 capsule (10 mg total) by mouth daily.  . [DISCONTINUED] amphetamine-dextroamphetamine (ADDERALL XR) 10 MG 24 hr capsule Take 1 capsule (10 mg total) by mouth daily.  . [DISCONTINUED] amphetamine-dextroamphetamine (ADDERALL XR) 10 MG 24 hr capsule Take 1 capsule (10 mg total) by mouth daily.  . [DISCONTINUED] amphetamine-dextroamphetamine (ADDERALL) 15  MG tablet Take 1 tablet by mouth daily. Take at 2pm.  . [DISCONTINUED] amphetamine-dextroamphetamine (ADDERALL) 15 MG tablet Take 1 tablet by mouth daily. Take at 2pm.  . [DISCONTINUED] amphetamine-dextroamphetamine (ADDERALL) 15 MG tablet Take 1 tablet by mouth daily. Take at 2pm.   No facility-administered encounter medications on file as of 03/14/2017.     Allergies: Latex  Body mass index is 25.19 kg/m.  Blood pressure 114/72, pulse 89, height 5\' 5"  (1.651 m), weight 151 lb 6.4 oz (68.7 kg).     Review of Systems  Constitutional: Positive for fatigue. Negative for activity  change, appetite change, chills, diaphoresis, fever and unexpected weight change.  HENT: Negative for congestion.   Eyes: Negative for visual disturbance.  Respiratory: Negative for cough, chest tightness, shortness of breath, wheezing and stridor.   Cardiovascular: Negative for chest pain and leg swelling.  Gastrointestinal: Negative for abdominal distention, abdominal pain, blood in stool, constipation, diarrhea, nausea and vomiting.  Endocrine: Negative for cold intolerance, heat intolerance, polydipsia, polyphagia and polyuria.  Genitourinary: Negative for difficulty urinating, flank pain and hematuria.  Musculoskeletal: Positive for arthralgias, back pain, joint swelling and myalgias. Negative for gait problem, neck pain and neck stiffness.  Skin: Negative for color change, pallor, rash and wound.  Neurological: Negative for dizziness, tremors, weakness and headaches.  Hematological: Does not bruise/bleed easily.  Psychiatric/Behavioral: Positive for decreased concentration and sleep disturbance. Negative for dysphoric mood, hallucinations, self-injury and suicidal ideas. The patient is not nervous/anxious and is not hyperactive.        Objective:   Physical Exam  Constitutional: She is oriented to person, place, and time. She appears well-developed and well-nourished. No distress.  Cardiovascular: Normal rate, regular rhythm and normal heart sounds.   No murmur heard. Pulmonary/Chest: Effort normal and breath sounds normal. No respiratory distress. She has no wheezes. She has no rales. She exhibits no tenderness.  Neurological: She is alert and oriented to person, place, and time. Coordination normal.  Skin: Skin is warm and dry. No rash noted. She is not diaphoretic. No erythema. No pallor.  Psychiatric: She has a normal mood and affect. Her behavior is normal. Judgment and thought content normal.  Nursing note and vitals reviewed.         Assessment & Plan:   1. Attention  deficit hyperactivity disorder (ADHD), unspecified ADHD type   2. Health care maintenance   3. Depression, recurrent (HCC)     ADHD (attention deficit hyperactivity disorder) Sx's controlled on Adderall 15mg  and Adderall XR 10mg . Advised to see therapist at least every 2 months. She denies medication SE.   Health care maintenance Strive to drink at least 75 ounces water/day. Follow heart healthy diet. Stretch/meditate prior to bedtime. Recommend to see therapist at least once every 2 months. CPE in 3 months.  Depression, recurrent (HCC) Well controlled, continue Bupropion XL 300mg  daily. Recommend to see therapist at least every 2 months.    FOLLOW-UP:  Return in about 3 months (around 06/14/2017) for Regular Follow Up, CPE.

## 2017-03-14 NOTE — Assessment & Plan Note (Signed)
Well controlled, continue Bupropion XL 300mg  daily. Recommend to see therapist at least every 2 months.

## 2017-05-23 ENCOUNTER — Other Ambulatory Visit: Payer: Self-pay | Admitting: Adult Health

## 2017-05-23 NOTE — Telephone Encounter (Signed)
Pt has CPE scheduled 06/14/17.  Per previous discussion with William HamburgerKaty Danford, NP ok to refill this medication when needed.

## 2017-05-26 ENCOUNTER — Other Ambulatory Visit: Payer: Self-pay | Admitting: Adult Health

## 2017-06-13 NOTE — Progress Notes (Signed)
Subjective:    Patient ID: Ann Christensen, female    DOB: August 04, 1985, 31 y.o.   MRN: [id number]  HPI: 03/14/17 OV:  Ms. Ann Christensen is here for regular f/u: ADHD, anxiety, depression, GED, insomnia, and chronic/diffuse muscle soreness/spasm. She is compliant on all medications and denies SE.  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  She has been trying to increase water intake and eats "whatever I really want b/c I move all day".  She continues to have muscle soreness/aches and visits her chiropractor at least monthly.  She cannot recall her last therapy appt, however reports that her anxiety/depression have been stable.  She continues to avoid tobacco/EOTH use.  06/13/17 OV: Ms. Ann Christensen is here for CPE.  She reports medication compliance and denies SE.  She continues to increase water, est to drink >50 oz/day.  She follows heart healthy diet and moves "all day, everyday" running a horse farm.   She denies tobacco/ETOH use and sleeps very well at night. Her mother requires daily care during her ca treatment, which has been an added stressor. She reports depression/anxiety stable and is now seeing her therapist PRN, last OV 3 months ago. Healthcare Maintenance: PAP-not due until 2019 Mammogram- not indicated Colonoscopy-not indicated  Patient Care Team    Relationship Specialty Notifications Start End  Julaine Fusianford, Job Holtsclaw D, NP PCP - General Family Medicine  08/09/16     Patient Active Problem List   Diagnosis Date Noted  . Health care maintenance 12/08/2016  . ADHD (attention deficit hyperactivity disorder) 09/09/2016  . Insomnia 09/09/2016  . Depression, recurrent (HCC) 09/09/2016  . Anxiety 09/09/2016  . ASTHMA 09/28/2007  . GERD 09/28/2007  . HIATAL HERNIA 09/28/2007  . IRRITABLE BOWEL SYNDROME 09/28/2007     Past Medical History:  Diagnosis Date  . Anxiety   . Depression      Past Surgical History:  Procedure Laterality Date  . TONSILLECTOMY AND  ADENOIDECTOMY     She was 31 years old  . WISDOM TOOTH EXTRACTION  2011     Family History  Problem Relation Age of Onset  . Hypertension Mother   . Alcohol abuse Father   . Hypertension Father   . Cancer Maternal Aunt        colon  . Cancer Maternal Grandmother        colon  . Diabetes Paternal Grandmother      Social History   Substance and Sexual Activity  Drug Use Yes  . Frequency: 10.0 times per week  . Types: Marijuana     Social History   Substance and Sexual Activity  Alcohol Use Yes   Comment: Rare     Social History   Tobacco Use  Smoking Status Never Smoker  Smokeless Tobacco Never Used     Outpatient Encounter Medications as of 06/14/2017  Medication Sig  . [START ON 08/12/2017] amphetamine-dextroamphetamine (ADDERALL XR) 10 MG 24 hr capsule Take 1 capsule (10 mg total) by mouth daily.  Ann Christensen. [START ON 08/12/2017] amphetamine-dextroamphetamine (ADDERALL) 15 MG tablet Take 1 tablet by mouth daily. Take at 2pm.  . buPROPion (WELLBUTRIN XL) 300 MG 24 hr tablet TAKE 1 TABLET BY MOUTH  DAILY  . cloNIDine (CATAPRES) 0.1 MG tablet TAKE 1 TABLET BY MOUTH  DAILY AS NEEDED  . cyclobenzaprine (FLEXERIL) 10 MG tablet TAKE 1 TABLET BY MOUTH 3  TIMES DAILY AS NEEDED FOR  MUSCLE SPASM(S)  . Multiple Vitamin (MULTIVITAMIN) tablet Take 1 tablet by mouth  daily.  . Norethin Ace-Eth Estrad-FE (NORETHINDRONE ACET-ETHINYL EST) 1-20 MG-MCG(24) CHEW Chew 1 tablet by mouth daily.  Ann Christensen. spironolactone (ALDACTONE) 100 MG tablet Take 1 tablet (100 mg total) by mouth daily.  . [DISCONTINUED] amphetamine-dextroamphetamine (ADDERALL XR) 10 MG 24 hr capsule Take 1 capsule (10 mg total) by mouth daily.  . [DISCONTINUED] amphetamine-dextroamphetamine (ADDERALL XR) 10 MG 24 hr capsule Take 1 capsule (10 mg total) by mouth daily.  . [DISCONTINUED] amphetamine-dextroamphetamine (ADDERALL XR) 10 MG 24 hr capsule Take 1 capsule (10 mg total) by mouth daily.  . [DISCONTINUED]  amphetamine-dextroamphetamine (ADDERALL) 15 MG tablet Take 1 tablet by mouth daily. Take at 2pm.  . [DISCONTINUED] amphetamine-dextroamphetamine (ADDERALL) 15 MG tablet Take 1 tablet by mouth daily. Take at 2pm.  . [DISCONTINUED] amphetamine-dextroamphetamine (ADDERALL) 15 MG tablet Take 1 tablet by mouth daily. Take at 2pm.  . [DISCONTINUED] cyclobenzaprine (FLEXERIL) 10 MG tablet TAKE 1 TABLET BY MOUTH 3  TIMES DAILY AS NEEDED FOR  MUSCLE SPASM(S)  . [DISCONTINUED] cyclobenzaprine (FLEXERIL) 10 MG tablet TAKE 1 TABLET BY MOUTH 3  TIMES DAILY AS NEEDED FOR  MUSCLE SPASM(S)   No facility-administered encounter medications on file as of 06/14/2017.     Allergies: Latex  There is no height or weight on file to calculate BMI.  There were no vitals taken for this visit.     Review of Systems  Constitutional: Positive for fatigue. Negative for activity change, appetite change, chills, diaphoresis, fever and unexpected weight change.  HENT: Negative for congestion.   Eyes: Negative for visual disturbance.  Respiratory: Negative for cough, chest tightness, shortness of breath, wheezing and stridor.   Cardiovascular: Negative for chest pain and leg swelling.  Gastrointestinal: Negative for abdominal distention, abdominal pain, blood in stool, constipation, diarrhea, nausea and vomiting.  Endocrine: Negative for cold intolerance, heat intolerance, polydipsia, polyphagia and polyuria.  Genitourinary: Negative for difficulty urinating, flank pain and hematuria.  Musculoskeletal: Positive for arthralgias, back pain, joint swelling and myalgias. Negative for gait problem, neck pain and neck stiffness.  Skin: Negative for color change, pallor, rash and wound.  Neurological: Negative for dizziness, tremors, weakness and headaches.  Hematological: Does not bruise/bleed easily.  Psychiatric/Behavioral: Positive for decreased concentration. Negative for dysphoric mood, hallucinations, self-injury,  sleep disturbance and suicidal ideas. The patient is not nervous/anxious and is not hyperactive.        Objective:   Physical Exam  Constitutional: She is oriented to person, place, and time. She appears well-developed and well-nourished. No distress.  HENT:  Head: Normocephalic and atraumatic.  Right Ear: Hearing, tympanic membrane, external ear and ear canal normal. Tympanic membrane is not erythematous and not bulging. No decreased hearing is noted.  Left Ear: Hearing, tympanic membrane, external ear and ear canal normal. Tympanic membrane is not erythematous and not bulging.  Nose: Nose normal. Right sinus exhibits no maxillary sinus tenderness and no frontal sinus tenderness. Left sinus exhibits no maxillary sinus tenderness and no frontal sinus tenderness.  Mouth/Throat: Uvula is midline, oropharynx is clear and moist and mucous membranes are normal.  Eyes: Conjunctivae are normal. Pupils are equal, round, and reactive to light.  Neck: Normal range of motion. Neck supple.  Cardiovascular: Normal rate, regular rhythm and normal heart sounds.  No murmur heard. Pulmonary/Chest: Effort normal and breath sounds normal. No respiratory distress. She has no decreased breath sounds. She has no wheezes. She has no rhonchi. She has no rales. She exhibits no tenderness. Right breast exhibits no inverted nipple, no mass, no  nipple discharge, no skin change and no tenderness. Left breast exhibits no inverted nipple, no mass, no nipple discharge, no skin change and no tenderness. Breasts are symmetrical.  Abdominal: Soft. Bowel sounds are normal. She exhibits no distension and no mass. There is no tenderness. There is no rebound and no guarding.  Genitourinary:  Genitourinary Comments: Declined pelvic   Musculoskeletal: Normal range of motion.  Lymphadenopathy:    She has no cervical adenopathy.  Neurological: She is alert and oriented to person, place, and time. Coordination normal.  Skin: Skin is  warm and dry. No rash noted. She is not diaphoretic. No erythema. No pallor.  Psychiatric: She has a normal mood and affect. Her behavior is normal. Judgment and thought content normal.  Nursing note and vitals reviewed.         Assessment & Plan:   1. Depression, recurrent (HCC)   2. Attention deficit hyperactivity disorder (ADHD), unspecified ADHD type   3. Health care maintenance     ADHD (attention deficit hyperactivity disorder) [place] Controlled Substance Database reviewed-no contraindications noted Adderall XR 10mg  and Adderall 15mg  refills provided. She is very compliant and tolerating med well, she can call in for one refill cycle prior to OV in 6 months.  Depression, recurrent (HCC) Very well controlled, continue Bupropion XL 300mg  daily PRN therapy appt's, last OV >3 months ago   Health care maintenance WATER, WATER, WATER Continue all medications as directed and follow-up with your therapist as directed. Follow-up in 6 months with fasting labs, however med refill can be made in 3 months without office visit.    FOLLOW-UP:  Return in about 6 months (around 12/12/2017). 1. Depression, recurrent (HCC)   2. Attention deficit hyperactivity disorder (ADHD), unspecified ADHD type   3. Health care maintenance

## 2017-06-14 ENCOUNTER — Ambulatory Visit (INDEPENDENT_AMBULATORY_CARE_PROVIDER_SITE_OTHER): Payer: BLUE CROSS/BLUE SHIELD | Admitting: Adult Health

## 2017-06-14 ENCOUNTER — Encounter: Payer: Self-pay | Admitting: Adult Health

## 2017-06-14 DIAGNOSIS — F339 Major depressive disorder, recurrent, unspecified: Secondary | ICD-10-CM

## 2017-06-14 DIAGNOSIS — Z0000 Encounter for general adult medical examination without abnormal findings: Secondary | ICD-10-CM

## 2017-06-14 DIAGNOSIS — F909 Attention-deficit hyperactivity disorder, unspecified type: Secondary | ICD-10-CM | POA: Diagnosis not present

## 2017-06-14 MED ORDER — AMPHETAMINE-DEXTROAMPHET ER 10 MG PO CP24: 10.0000 mg | ORAL_CAPSULE | Freq: Every day | ORAL | 0 refills | Status: DC

## 2017-06-14 MED ORDER — AMPHETAMINE-DEXTROAMPHETAMINE 15 MG PO TABS: 15.0000 mg | ORAL_TABLET | Freq: Every day | ORAL | 0 refills | Status: DC

## 2017-06-14 NOTE — Assessment & Plan Note (Signed)
[place] Controlled Substance Database reviewed-no contraindications noted Adderall XR 10mg  and Adderall 15mg  refills provided. She is very compliant and tolerating med well, she can call in for one refill cycle prior to OV in 6 months.

## 2017-06-14 NOTE — Patient Instructions (Addendum)
Heart-Healthy Eating Plan Many factors influence your heart health, including eating and exercise habits. Heart (coronary) risk increases with abnormal blood fat (lipid) levels. Heart-healthy meal planning includes limiting unhealthy fats, increasing healthy fats, and making other small dietary changes. This includes maintaining a healthy body weight to help keep lipid levels within a normal range. What is my plan? Your health care provider recommends that you:  Get no more than __25____% of the total calories in your daily diet from fat.  Limit your intake of saturated fat to less than ___5___% of your total calories each day.  Limit the amount of cholesterol in your diet to less than __300___ mg per day.  What types of fat should I choose?  Choose healthy fats more often. Choose monounsaturated and polyunsaturated fats, such as olive oil and canola oil, flaxseeds, walnuts, almonds, and seeds.  Eat more omega-3 fats. Good choices include salmon, mackerel, sardines, tuna, flaxseed oil, and ground flaxseeds. Aim to eat fish at least two times each week.  Limit saturated fats. Saturated fats are primarily found in animal products, such as meats, butter, and cream. Plant sources of saturated fats include palm oil, palm kernel oil, and coconut oil.  Avoid foods with partially hydrogenated oils in them. These contain trans fats. Examples of foods that contain trans fats are stick margarine, some tub margarines, cookies, crackers, and other baked goods. What general guidelines do I need to follow?  Check food labels carefully to identify foods with trans fats or high amounts of saturated fat.  Fill one half of your plate with vegetables and green salads. Eat 4-5 servings of vegetables per day. A serving of vegetables equals 1 cup of raw leafy vegetables,  cup of raw or cooked cut-up vegetables, or  cup of vegetable juice.  Fill one fourth of your plate with whole grains. Look for the word  "whole" as the first word in the ingredient list.  Fill one fourth of your plate with lean protein foods.  Eat 4-5 servings of fruit per day. A serving of fruit equals one medium whole fruit,  cup of dried fruit,  cup of fresh, frozen, or canned fruit, or  cup of 100% fruit juice.  Eat more foods that contain soluble fiber. Examples of foods that contain this type of fiber are apples, broccoli, carrots, beans, peas, and barley. Aim to get 20-30 g of fiber per day.  Eat more home-cooked food and less restaurant, buffet, and fast food.  Limit or avoid alcohol.  Limit foods that are high in starch and sugar.  Avoid fried foods.  Cook foods by using methods other than frying. Baking, boiling, grilling, and broiling are all great options. Other fat-reducing suggestions include: ? Removing the skin from poultry. ? Removing all visible fats from meats. ? Skimming the fat off of stews, soups, and gravies before serving them. ? Steaming vegetables in water or broth.  Lose weight if you are overweight. Losing just 5-10% of your initial body weight can help your overall health and prevent diseases such as diabetes and heart disease.  Increase your consumption of nuts, legumes, and seeds to 4-5 servings per week. One serving of dried beans or legumes equals  cup after being cooked, one serving of nuts equals 1 ounces, and one serving of seeds equals  ounce or 1 tablespoon.  You may need to monitor your salt (sodium) intake, especially if you have high blood pressure. Talk with your health care provider or dietitian to get  more information about reducing sodium. What foods can I eat? Grains  Breads, including French, white, pita, wheat, raisin, rye, oatmeal, and Italian. Tortillas that are neither fried nor made with lard or trans fat. Low-fat rolls, including hotdog and hamburger buns and English muffins. Biscuits. Muffins. Waffles. Pancakes. Light popcorn. Whole-grain cereals. Flatbread.  Melba toast. Pretzels. Breadsticks. Rusks. Low-fat snacks and crackers, including oyster, saltine, matzo, graham, animal, and rye. Rice and pasta, including brown rice and those that are made with whole wheat. Vegetables All vegetables. Fruits All fruits, but limit coconut. Meats and Other Protein Sources Lean, well-trimmed beef, veal, pork, and lamb. Chicken and turkey without skin. All fish and shellfish. Wild duck, rabbit, pheasant, and venison. Egg whites or low-cholesterol egg substitutes. Dried beans, peas, lentils, and tofu.Seeds and most nuts. Dairy Low-fat or nonfat cheeses, including ricotta, string, and mozzarella. Skim or 1% milk that is liquid, powdered, or evaporated. Buttermilk that is made with low-fat milk. Nonfat or low-fat yogurt. Beverages Mineral water. Diet carbonated beverages. Sweets and Desserts Sherbets and fruit ices. Honey, jam, marmalade, jelly, and syrups. Meringues and gelatins. Pure sugar candy, such as hard candy, jelly beans, gumdrops, mints, marshmallows, and small amounts of dark chocolate. Angel food cake. Eat all sweets and desserts in moderation. Fats and Oils Nonhydrogenated (trans-free) margarines. Vegetable oils, including soybean, sesame, sunflower, olive, peanut, safflower, corn, canola, and cottonseed. Salad dressings or mayonnaise that are made with a vegetable oil. Limit added fats and oils that you use for cooking, baking, salads, and as spreads. Other Cocoa powder. Coffee and tea. All seasonings and condiments. The items listed above may not be a complete list of recommended foods or beverages. Contact your dietitian for more options. What foods are not recommended? Grains Breads that are made with saturated or trans fats, oils, or whole milk. Croissants. Butter rolls. Cheese breads. Sweet rolls. Donuts. Buttered popcorn. Chow mein noodles. High-fat crackers, such as cheese or butter crackers. Meats and Other Protein Sources Fatty meats, such  as hotdogs, short ribs, sausage, spareribs, bacon, ribeye roast or steak, and mutton. High-fat deli meats, such as salami and bologna. Caviar. Domestic duck and goose. Organ meats, such as kidney, liver, sweetbreads, brains, gizzard, chitterlings, and heart. Dairy Cream, sour cream, cream cheese, and creamed cottage cheese. Whole milk cheeses, including blue (bleu), Monterey Jack, Brie, Colby, American, Havarti, Swiss, cheddar, Camembert, and [place]. Whole or 2% milk that is liquid, evaporated, or condensed. Whole buttermilk. Cream sauce or high-fat cheese sauce. Yogurt that is made from whole milk. Beverages Regular sodas and drinks with added sugar. Sweets and Desserts Frosting. Pudding. Cookies. Cakes other than angel food cake. Candy that has milk chocolate or white chocolate, hydrogenated fat, butter, coconut, or unknown ingredients. Buttered syrups. Full-fat ice cream or ice cream drinks. Fats and Oils Gravy that has suet, meat fat, or shortening. Cocoa butter, hydrogenated oils, palm oil, coconut oil, palm kernel oil. These can often be found in baked products, candy, fried foods, nondairy creamers, and whipped toppings. Solid fats and shortenings, including bacon fat, salt pork, lard, and butter. Nondairy cream substitutes, such as coffee creamers and sour cream substitutes. Salad dressings that are made of unknown oils, cheese, or sour cream. The items listed above may not be a complete list of foods and beverages to avoid. Contact your dietitian for more information. This information is not intended to replace advice given to you by your health care provider. Make sure you discuss any questions you have with your health care   provider. Document Released: 04/13/2008 Document Revised: 01/23/2016 Document Reviewed: 12/27/2013 Elsevier Interactive Patient Education  2017 Elsevier [employer].  WATER, WATER, WATER Continue all medications as directed and follow-up with your therapist as  directed. Follow-up in 6 months with fasting labs, however med refill can be made in 3 months without office visit. NICE TO SEE YOU! YOU ARE DOING GREAT!

## 2017-06-14 NOTE — Assessment & Plan Note (Signed)
WATER, WATER, WATER Continue all medications as directed and follow-up with your therapist as directed. Follow-up in 6 months with fasting labs, however med refill can be made in 3 months without office visit.

## 2017-06-14 NOTE — Assessment & Plan Note (Signed)
Very well controlled, continue Bupropion XL 300mg  daily PRN therapy appt's, last OV >3 months ago

## 2017-06-20 ENCOUNTER — Other Ambulatory Visit: Payer: Self-pay | Admitting: Adult Health

## 2017-07-29 ENCOUNTER — Other Ambulatory Visit: Payer: Self-pay | Admitting: Adult Health

## 2017-08-03 ENCOUNTER — Other Ambulatory Visit: Payer: Self-pay | Admitting: Adult Health

## 2017-08-27 ENCOUNTER — Other Ambulatory Visit: Payer: Self-pay | Admitting: Adult Health

## 2017-09-24 ENCOUNTER — Other Ambulatory Visit: Payer: Self-pay | Admitting: Adult Health

## 2017-09-27 ENCOUNTER — Other Ambulatory Visit: Payer: Self-pay | Admitting: Adult Health

## 2017-09-27 ENCOUNTER — Other Ambulatory Visit: Payer: Self-pay

## 2017-09-27 MED ORDER — AMPHETAMINE-DEXTROAMPHET ER 10 MG PO CP24: 10.0000 mg | ORAL_CAPSULE | Freq: Every day | ORAL | 0 refills | Status: DC

## 2017-09-27 MED ORDER — AMPHETAMINE-DEXTROAMPHETAMINE 15 MG PO TABS: 15.0000 mg | ORAL_TABLET | Freq: Every day | ORAL | 0 refills | Status: DC

## 2017-09-27 NOTE — Telephone Encounter (Signed)
Good Morning, We have never provided Burpirone (Buspar) She has been on Bupropion (Wellbutrin) Please clarify with [facility] Thanks! Orpha BurKaty

## 2017-09-27 NOTE — Telephone Encounter (Signed)
Pt states that she did not request this medication refill from the pharmacy.  She does, however, need refills on Adderall.  Please review and authorize if appropriate.  Tiajuana Amass. Elfrieda Espino, CMA

## 2017-09-27 NOTE — Telephone Encounter (Signed)
Received refill request for buspirone, however I do not find this medication listed on her med list.  Please review.  Tiajuana Amass. Nelson, CMA

## 2017-09-27 NOTE — Telephone Encounter (Signed)
Spoke with [facility] who states that the patient initiated this request.  She has not had this filled with their pharmacy since 2017.  Tiajuana Amass. Brantlee Hinde, CMA

## 2017-09-27 NOTE — Telephone Encounter (Signed)
Thank you for clarification. Pt will need OV to discuss Thanks! Orpha BurKaty

## 2017-10-22 ENCOUNTER — Other Ambulatory Visit: Payer: Self-pay | Admitting: Adult Health

## 2017-11-04 ENCOUNTER — Other Ambulatory Visit: Payer: Self-pay | Admitting: Adult Health

## 2017-11-07 ENCOUNTER — Telehealth: Payer: Self-pay | Admitting: Adult Health

## 2017-11-07 NOTE — Telephone Encounter (Signed)
Done.  T. Nelson, CMA °

## 2017-11-07 NOTE — Telephone Encounter (Signed)
Patient is requesting a refill of both her Adderall prescriptions. If approved please send to [facility]

## 2017-11-15 DIAGNOSIS — S8012XA Contusion of left lower leg, initial encounter: Secondary | ICD-10-CM | POA: Diagnosis not present

## 2017-12-05 ENCOUNTER — Other Ambulatory Visit: Payer: Self-pay

## 2017-12-05 MED ORDER — AMPHETAMINE-DEXTROAMPHETAMINE 15 MG PO TABS: ORAL_TABLET | ORAL | 0 refills | Status: DC

## 2017-12-06 ENCOUNTER — Other Ambulatory Visit: Payer: Self-pay | Admitting: Adult Health

## 2017-12-07 ENCOUNTER — Other Ambulatory Visit: Payer: Self-pay

## 2017-12-07 ENCOUNTER — Telehealth: Payer: Self-pay

## 2017-12-07 MED ORDER — AMPHETAMINE-DEXTROAMPHETAMINE 15 MG PO TABS: ORAL_TABLET | ORAL | 0 refills | Status: DC

## 2017-12-07 MED ORDER — AMPHETAMINE-DEXTROAMPHET ER 10 MG PO CP24: 10.0000 mg | ORAL_CAPSULE | Freq: Every day | ORAL | 0 refills | Status: DC

## 2017-12-07 NOTE — Telephone Encounter (Signed)
Adderall  sent to [facility] rather than [facility] on 12/05/17.  Pt requests that we resend RX to [facility].  Also, pt's insurance will only allow 30 day supply per fill.  Pt needs new RX for Adderall  sent to [facility].  Ann Christensen, CMA

## 2017-12-08 NOTE — Progress Notes (Deleted)
Subjective:    Patient ID: Ann Christensen, female    DOB: 07/21/1985, 32 y.o.   MRN: [id number]  HPI: 03/14/17 OV:  Ann Christensen is here for regular f/u: ADHD, anxiety, depression, GERD, insomnia, and chronic/diffuse muscle soreness/spasm. She is compliant on all medications and denies SE.  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  She has been trying to increase water intake and eats "whatever I really want b/c I move all day".  She continues to have muscle soreness/aches and visits her chiropractor at least monthly.  She cannot recall her last therapy appt, however reports that her anxiety/depression have been stable.  She continues to avoid tobacco/EOTH use.  06/13/17 OV: Ann Christensen is here for CPE.  She reports medication compliance and denies SE.  She continues to increase water, est to drink >50 oz/day.  She follows heart healthy diet and moves "all day, everyday" running a horse farm.   She denies tobacco/ETOH use and sleeps very well at night. Her mother requires daily care during her ca treatment, which has been an added stressor. She reports depression/anxiety stable and is now seeing her therapist PRN, last OV 3 months ago. Healthcare Maintenance: PAP-not due until 2019 Mammogram- not indicated Colonoscopy-not indicated  12/13/17 OV: Ann Christensen is here for regular f/u: ADHD, anxiety, depression, GERD, insomnia She has  Patient Care Team    Relationship Specialty Notifications Start End  Julaine Fusi, NP PCP - General Family Medicine  08/09/16   Obgyn, [facility]    06/30/17   [facility], [facility]  Internal Medicine  06/30/17     Patient Active Problem List   Diagnosis Date Noted  . Health care maintenance 12/08/2016  . ADHD (attention deficit hyperactivity disorder) 09/09/2016  . Insomnia 09/09/2016  . Depression, recurrent (HCC) 09/09/2016  . Anxiety 09/09/2016  . ASTHMA 09/28/2007  . GERD 09/28/2007  . HIATAL HERNIA 09/28/2007  . IRRITABLE  BOWEL SYNDROME 09/28/2007     Past Medical History:  Diagnosis Date  . Anxiety   . Depression      Past Surgical History:  Procedure Laterality Date  . TONSILLECTOMY AND ADENOIDECTOMY     She was 33 years old  . WISDOM TOOTH EXTRACTION  2011     Family History  Problem Relation Age of Onset  . Hypertension Mother   . Alcohol abuse Father   . Hypertension Father   . Cancer Maternal Aunt        colon  . Cancer Maternal Grandmother        colon  . Diabetes Paternal Grandmother      Social History   Substance and Sexual Activity  Drug Use Yes  . Frequency: 10.0 times per week  . Types: Marijuana     Social History   Substance and Sexual Activity  Alcohol Use Yes   Comment: Rare     Social History   Tobacco Use  Smoking Status Never Smoker  Smokeless Tobacco Never Used     Outpatient Encounter Medications as of 12/13/2017  Medication Sig  . amphetamine-dextroamphetamine (ADDERALL XR) 10 MG 24 hr capsule Take 1 capsule (10 mg total) by mouth daily.  [name] amphetamine-dextroamphetamine (ADDERALL) 15 MG tablet TAKE ONE (1) TABLET BY MOUTH EVERY DAY AT 2PM  . buPROPion (WELLBUTRIN XL) 300 MG 24 hr tablet TAKE 1 TABLET BY MOUTH  DAILY  . cloNIDine (CATAPRES) 0.1 MG tablet TAKE 1 TABLET BY MOUTH  DAILY AS NEEDED  .  cyclobenzaprine (FLEXERIL) 10 MG tablet TAKE 1 TABLET BY MOUTH 3  TIMES DAILY AS NEEDED FOR  MUSCLE SPASM(S)  . cyclobenzaprine (FLEXERIL) 10 MG tablet TAKE 1 TABLET BY MOUTH 3  TIMES DAILY AS NEEDED FOR  MUSCLE SPASM(S)  . cyclobenzaprine (FLEXERIL) 10 MG tablet TAKE 1 TABLET BY MOUTH 3  TIMES DAILY AS NEEDED FOR  MUSCLE SPASM(S)  . cyclobenzaprine (FLEXERIL) 10 MG tablet TAKE 1 TABLET BY MOUTH 3  TIMES DAILY AS NEEDED FOR  MUSCLE SPASM(S)  . cyclobenzaprine (FLEXERIL) 10 MG tablet TAKE 1 TABLET BY MOUTH 3  TIMES DAILY AS NEEDED FOR  MUSCLE SPASM(S)  . MIBELAS 24 FE 1-20 MG-MCG(24) CHEW CHEW AND SWALLOW 1 TABLET   DAILY  . Multiple Vitamin  (MULTIVITAMIN) tablet Take 1 tablet by mouth daily.  [name] spironolactone (ALDACTONE) 100 MG tablet Take 1 tablet (100 mg total) by mouth daily.   No facility-administered encounter medications on file as of 12/13/2017.     Allergies: Latex  There is no height or weight on file to calculate BMI.  There were no vitals taken for this visit.     Review of Systems  Constitutional: Positive for fatigue. Negative for activity change, appetite change, chills, diaphoresis, fever and unexpected weight change.  HENT: Negative for congestion.   Eyes: Negative for visual disturbance.  Respiratory: Negative for cough, chest tightness, shortness of breath, wheezing and stridor.   Cardiovascular: Negative for chest pain and leg swelling.  Gastrointestinal: Negative for abdominal distention, abdominal pain, blood in stool, constipation, diarrhea, nausea and vomiting.  Endocrine: Negative for cold intolerance, heat intolerance, polydipsia, polyphagia and polyuria.  Genitourinary: Negative for difficulty urinating, flank pain and hematuria.  Musculoskeletal: Positive for arthralgias, back pain, joint swelling and myalgias. Negative for gait problem, neck pain and neck stiffness.  Skin: Negative for color change, pallor, rash and wound.  Neurological: Negative for dizziness, tremors, weakness and headaches.  Hematological: Does not bruise/bleed easily.  Psychiatric/Behavioral: Positive for decreased concentration. Negative for dysphoric mood, hallucinations, self-injury, sleep disturbance and suicidal ideas. The patient is not nervous/anxious and is not hyperactive.        Objective:   Physical Exam  Constitutional: She is oriented to person, place, and time. She appears well-developed and well-nourished. No distress.  HENT:  Head: Normocephalic and atraumatic.  Right Ear: Hearing, tympanic membrane, external ear and ear canal normal. Tympanic membrane is not erythematous and not bulging. No decreased  hearing is noted.  Left Ear: Hearing, tympanic membrane, external ear and ear canal normal. Tympanic membrane is not erythematous and not bulging.  Nose: Nose normal. Right sinus exhibits no maxillary sinus tenderness and no frontal sinus tenderness. Left sinus exhibits no maxillary sinus tenderness and no frontal sinus tenderness.  Mouth/Throat: Uvula is midline, oropharynx is clear and moist and mucous membranes are normal.  Eyes: Pupils are equal, round, and reactive to light. Conjunctivae are normal.  Neck: Normal range of motion. Neck supple.  Cardiovascular: Normal rate, regular rhythm and normal heart sounds.  No murmur heard. Pulmonary/Chest: Effort normal and breath sounds normal. No respiratory distress. She has no decreased breath sounds. She has no wheezes. She has no rhonchi. She has no rales. She exhibits no tenderness. Right breast exhibits no inverted nipple, no mass, no nipple discharge, no skin change and no tenderness. Left breast exhibits no inverted nipple, no mass, no nipple discharge, no skin change and no tenderness. Breasts are symmetrical.  Abdominal: Soft. Bowel sounds are normal. She exhibits no distension  and no mass. There is no tenderness. There is no rebound and no guarding.  Genitourinary:  Genitourinary Comments: Declined pelvic   Musculoskeletal: Normal range of motion.  Lymphadenopathy:    She has no cervical adenopathy.  Neurological: She is alert and oriented to person, place, and time. Coordination normal.  Skin: Skin is warm and dry. No rash noted. She is not diaphoretic. No erythema. No pallor.  Psychiatric: She has a normal mood and affect. Her behavior is normal. Judgment and thought content normal.  Nursing note and vitals reviewed.         Assessment & Plan:   No diagnosis found.  No problem-specific Assessment & Plan notes found for this encounter.    FOLLOW-UP:  No follow-ups on file. No diagnosis found.

## 2017-12-09 NOTE — Telephone Encounter (Signed)
Opened in error. T. Nelson, CMA 

## 2017-12-13 ENCOUNTER — Ambulatory Visit: Payer: Self-pay | Admitting: Adult Health

## 2017-12-15 ENCOUNTER — Ambulatory Visit: Payer: Self-pay | Admitting: Adult Health

## 2018-01-03 ENCOUNTER — Encounter: Payer: Self-pay | Admitting: Adult Health

## 2018-01-03 ENCOUNTER — Ambulatory Visit: Payer: 59 | Admitting: Adult Health

## 2018-01-03 VITALS — BP 132/87 | HR 81 | Ht 65.0 in | Wt 163.0 lb

## 2018-01-03 DIAGNOSIS — Z0000 Encounter for general adult medical examination without abnormal findings: Secondary | ICD-10-CM | POA: Diagnosis not present

## 2018-01-03 DIAGNOSIS — F909 Attention-deficit hyperactivity disorder, unspecified type: Secondary | ICD-10-CM

## 2018-01-03 DIAGNOSIS — R69 Illness, unspecified: Secondary | ICD-10-CM | POA: Diagnosis not present

## 2018-01-03 DIAGNOSIS — Z79899 Other long term (current) drug therapy: Secondary | ICD-10-CM | POA: Diagnosis not present

## 2018-01-03 HISTORY — DX: Other long term (current) drug therapy: Z79.899

## 2018-01-03 MED ORDER — CLONIDINE HCL 0.1 MG PO TABS: 0.1000 mg | ORAL_TABLET | Freq: Every day | ORAL | 0 refills | Status: DC | PRN

## 2018-01-03 MED ORDER — EPINEPHRINE 0.3 MG/0.3ML IJ SOAJ: 0.3000 mg | Freq: Once | INTRAMUSCULAR | 2 refills | Status: AC

## 2018-01-03 MED ORDER — AMPHETAMINE-DEXTROAMPHETAMINE 15 MG PO TABS: ORAL_TABLET | ORAL | 0 refills | Status: DC

## 2018-01-03 MED ORDER — SPIRONOLACTONE 100 MG PO TABS: 100.0000 mg | ORAL_TABLET | Freq: Every day | ORAL | 1 refills | Status: DC

## 2018-01-03 MED ORDER — AMPHETAMINE-DEXTROAMPHET ER 10 MG PO CP24: 10.0000 mg | ORAL_CAPSULE | Freq: Every day | ORAL | 0 refills | Status: DC

## 2018-01-03 MED ORDER — ALPRAZOLAM 0.25 MG PO TABS: 0.2500 mg | ORAL_TABLET | Freq: Two times a day (BID) | ORAL | 0 refills | Status: DC | PRN

## 2018-01-03 MED ORDER — BUPROPION HCL ER (XL) 300 MG PO TB24: 300.0000 mg | ORAL_TABLET | Freq: Every day | ORAL | 3 refills | Status: DC

## 2018-01-03 MED ORDER — CYCLOBENZAPRINE HCL 10 MG PO TABS: ORAL_TABLET | ORAL | 0 refills | Status: DC

## 2018-01-03 NOTE — Assessment & Plan Note (Signed)
[place] Cont[place] Sub[place] Database [place]- no aberrancies noted Adderall rx's refilled Will allow RFs until f/u in 6 months

## 2018-01-03 NOTE — Assessment & Plan Note (Signed)
Agreement discussed and contract signed Will update annually

## 2018-01-03 NOTE — Progress Notes (Signed)
Subjective:    Patient ID: Ann SimonCassandra M Christensen, female    DOB: Jul 20, 1985, 32 y.o.   MRN: [id number]  HPI: 03/14/17 OV:  Ms. [name] is here for regular f/u: ADHD, anxiety, depression, GERD, insomnia, and chronic/diffuse muscle soreness/spasm. She is compliant on all medications and denies SE.  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  She has been trying to increase water intake and eats "whatever I really want b/c I move all day".  She continues to have muscle soreness/aches and visits her chiropractor at least monthly.  She cannot recall her last therapy appt, however reports that her anxiety/depression have been stable.  She continues to avoid tobacco/EOTH use.  06/13/17 OV: Ms. [name] is here for CPE.  She reports medication compliance and denies SE.  She continues to increase water, est to drink >50 oz/day.  She follows heart healthy diet and moves "all day, everyday" running a horse farm.   She denies tobacco/ETOH use and sleeps very well at night. Her mother requires daily care during her ca treatment, which has been an added stressor. She reports depression/anxiety stable and is now seeing her therapist PRN, last OV 3 months ago. Healthcare Maintenance: PAP-not due until 2019 Mammogram- not indicated Colonoscopy-not indicated  01/03/18 OV:  Ms. 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 She suffered injury approx 3 weeks ago- horse bucked her off and saddle fell onto L thigh, causing large hematoma and muscle tear, [facility] [place]. She has been treating injury with rest, TENs unit, muscle relaxers, and OTC NSAIDs. She estimates to drink >75 oz water/day and follows a diet rich in CHO/vegetables/fruits. 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 She reports sleeping well  and denies exacerbation of acute stress/anxeity. She "checks-in" with her therapist Q3M She denies thoughts of harming herself/others She continues to avoid tobacco/ETOH She reports ingesting raw nuts a few weeks and experienced wheezing and "scratchy throat", treated with OTC Benadryl- requests refill on Epi-Pen We discussed her current rx regime and completed Controlled Substance Contract today.  Patient Care Team    Relationship Specialty Notifications Start End  William Hamburgeranford, Goddess Gebbia D, NP PCP - General Family Medicine  08/09/16   Obgyn, [facility]    06/30/17   [facility]  Internal Medicine  06/30/17     Patient Active Problem List   Diagnosis Date Noted  . Controlled substance agreement signed 01/03/2018  . Health care maintenance 12/08/2016  . ADHD (attention deficit hyperactivity disorder) 09/09/2016  . Insomnia 09/09/2016  . Depression, recurrent (HCC) 09/09/2016  . Anxiety 09/09/2016  . ASTHMA 09/28/2007  . GERD 09/28/2007  . HIATAL HERNIA 09/28/2007  . IRRITABLE BOWEL SYNDROME 09/28/2007     Past Medical History:  Diagnosis Date  . Anxiety   . Depression      Past Surgical History:  Procedure Laterality Date  . TONSILLECTOMY AND ADENOIDECTOMY     She was 32 years old  . WISDOM TOOTH EXTRACTION  2011     Family History  Problem Relation Age of Onset  . Hypertension Mother   . Alcohol abuse Father   . Hypertension Father   . Cancer Maternal Aunt        colon  . Cancer Maternal Grandmother        colon  .  Diabetes Paternal Grandmother      Social History   Substance and Sexual Activity  Drug Use Yes  . Frequency: 10.0 times per week  . Types: Marijuana     Social History   Substance and Sexual Activity  Alcohol Use Yes   Comment: Rare     Social History   Tobacco Use  Smoking Status Never Smoker  Smokeless Tobacco Never Used     Outpatient Encounter Medications as of 01/03/2018  Medication Sig  .  amphetamine-dextroamphetamine (ADDERALL XR) 10 MG 24 hr capsule Take 1 capsule (10 mg total) by mouth daily.  [name] amphetamine-dextroamphetamine (ADDERALL) 15 MG tablet TAKE ONE (1) TABLET BY MOUTH EVERY DAY AT 2PM  . buPROPion (WELLBUTRIN XL) 300 MG 24 hr tablet Take 1 tablet (300 mg total) by mouth daily.  . cloNIDine (CATAPRES) 0.1 MG tablet Take 1 tablet (0.1 mg total) by mouth daily as needed.  . cyclobenzaprine (FLEXERIL) 10 MG tablet TAKE 1 TABLET BY MOUTH 3  TIMES DAILY AS NEEDED FOR  MUSCLE SPASM(S)  . MIBELAS 24 FE 1-20 MG-MCG(24) CHEW CHEW AND SWALLOW 1 TABLET   DAILY  . Multiple Vitamin (MULTIVITAMIN) tablet Take 1 tablet by mouth daily.  [name] spironolactone (ALDACTONE) 100 MG tablet Take 1 tablet (100 mg total) by mouth daily.  . [DISCONTINUED] amphetamine-dextroamphetamine (ADDERALL XR) 10 MG 24 hr capsule Take 1 capsule (10 mg total) by mouth daily.  . [DISCONTINUED] amphetamine-dextroamphetamine (ADDERALL) 15 MG tablet TAKE ONE (1) TABLET BY MOUTH EVERY DAY AT 2PM  . [DISCONTINUED] buPROPion (WELLBUTRIN XL) 300 MG 24 hr tablet TAKE 1 TABLET BY MOUTH  DAILY  . [DISCONTINUED] cloNIDine (CATAPRES) 0.1 MG tablet TAKE 1 TABLET BY MOUTH  DAILY AS NEEDED  . [DISCONTINUED] cyclobenzaprine (FLEXERIL) 10 MG tablet TAKE 1 TABLET BY MOUTH 3  TIMES DAILY AS NEEDED FOR  MUSCLE SPASM(S)  . [DISCONTINUED] spironolactone (ALDACTONE) 100 MG tablet Take 1 tablet (100 mg total) by mouth daily.  [name] ALPRAZolam (XANAX) 0.25 MG tablet Take 1 tablet (0.25 mg total) by mouth 2 (two) times daily as needed for anxiety.  [name] EPINEPHrine 0.3 mg/0.3 mL IJ SOAJ injection Inject 0.3 mLs (0.3 mg total) into the muscle once for 1 dose.  . [DISCONTINUED] cyclobenzaprine (FLEXERIL) 10 MG tablet TAKE 1 TABLET BY MOUTH 3  TIMES DAILY AS NEEDED FOR  MUSCLE SPASM(S)  . [DISCONTINUED] cyclobenzaprine (FLEXERIL) 10 MG tablet TAKE 1 TABLET BY MOUTH 3  TIMES DAILY AS NEEDED FOR  MUSCLE SPASM(S)  . [DISCONTINUED] cyclobenzaprine  (FLEXERIL) 10 MG tablet TAKE 1 TABLET BY MOUTH 3  TIMES DAILY AS NEEDED FOR  MUSCLE SPASM(S)  . [DISCONTINUED] cyclobenzaprine (FLEXERIL) 10 MG tablet TAKE 1 TABLET BY MOUTH 3  TIMES DAILY AS NEEDED FOR  MUSCLE SPASM(S)   No facility-administered encounter medications on file as of 01/03/2018.     Allergies: Latex  Body mass index is 27.12 kg/m.  Blood pressure 132/87, pulse 81, height 5\' 5"  (1.651 m), weight 163 lb (73.9 kg), SpO2 100 %.  Review of Systems  Constitutional: Positive for fatigue. Negative for activity change, appetite change, chills, diaphoresis, fever and unexpected weight change.  HENT: Negative for congestion.   Eyes: Negative for visual disturbance.  Respiratory: Negative for cough, chest tightness, shortness of breath, wheezing and stridor.   Cardiovascular: Negative for chest pain and leg swelling.  Gastrointestinal: Negative for abdominal distention, abdominal pain, blood in stool, constipation, diarrhea, nausea and vomiting.  Endocrine: Negative for cold intolerance, heat intolerance, polydipsia,  polyphagia and polyuria.  Genitourinary: Negative for difficulty urinating, flank pain and hematuria.  Musculoskeletal: Positive for arthralgias, back pain, joint swelling and myalgias. Negative for gait problem, neck pain and neck stiffness.  Skin: Negative for color change, pallor, rash and wound.  Neurological: Negative for dizziness, tremors, weakness and headaches.  Hematological: Does not bruise/bleed easily.  Psychiatric/Behavioral: Positive for decreased concentration. Negative for dysphoric mood, hallucinations, self-injury, sleep disturbance and suicidal ideas. The patient is not nervous/anxious and is not hyperactive.        Objective:   Physical Exam  Constitutional: She is oriented to person, place, and time. She appears well-developed and well-nourished. No distress.  HENT:  Head: Normocephalic and atraumatic.  Right Ear: Hearing, tympanic membrane,  external ear and ear canal normal. Tympanic membrane is not erythematous and not bulging. No decreased hearing is noted.  Left Ear: Hearing, tympanic membrane, external ear and ear canal normal. Tympanic membrane is not erythematous and not bulging.  Nose: Nose normal. Right sinus exhibits no maxillary sinus tenderness and no frontal sinus tenderness. Left sinus exhibits no maxillary sinus tenderness and no frontal sinus tenderness.  Mouth/Throat: Uvula is midline, oropharynx is clear and moist and mucous membranes are normal.  Eyes: Pupils are equal, round, and reactive to light. Conjunctivae are normal.  Cardiovascular: Normal rate, regular rhythm and normal heart sounds.  No murmur heard. Pulmonary/Chest: Effort normal and breath sounds normal. No respiratory distress. She has no decreased breath sounds. She has no wheezes. She has no rhonchi. She has no rales. She exhibits no tenderness. Right breast exhibits no inverted nipple, no mass, no nipple discharge, no skin change and no tenderness. Left breast exhibits no inverted nipple, no mass, no nipple discharge, no skin change and no tenderness. Breasts are symmetrical.  Musculoskeletal: Normal range of motion. She exhibits edema and deformity. She exhibits no tenderness.       Left upper leg: She exhibits edema and deformity. She exhibits no tenderness.  Neurological: She is alert and oriented to person, place, and time.  Skin: She is not diaphoretic.  Psychiatric: She has a normal mood and affect. Her behavior is normal. Judgment and thought content normal.  Nursing note and vitals reviewed.     Assessment & Plan:   1. Attention deficit hyperactivity disorder (ADHD), unspecified ADHD type   2. Health care maintenance   3. Controlled substance agreement signed     ADHD (attention deficit hyperactivity disorder) [place]  Controlled Substance Database reviewed- no aberrancies noted Adderall rx's refilled Will allow RFs until f/u in  6 months  Controlled substance agreement signed Agreement discussed and contract signed Will update annually   Health care maintenance Increase water intake, strive for at least  80 ounces/day.   Follow Heart Healthy diet Continue all medications as directed. Okay to refill controlled substances until next follow-up in 6 months. Controlled Substance Contract Completed today.   F/U 6 months

## 2018-01-03 NOTE — Patient Instructions (Signed)
Mediterranean Diet A Mediterranean diet refers to food and lifestyle choices that are based on the traditions of countries located on the [place] Sea. This way of eating has been shown to help prevent certain conditions and improve outcomes for people who have chronic diseases, like kidney disease and heart disease. What are tips for following this plan? Lifestyle  Cook and eat meals together with your family, when possible.  Drink enough fluid to keep your urine clear or pale yellow.  Be physically active every day. This includes: ? Aerobic exercise like running or swimming. ? Leisure activities like gardening, walking, or housework.  Get 7-8 hours of sleep each night.  If recommended by your health care provider, drink red wine in moderation. This means 1 glass a day for nonpregnant women and 2 glasses a day for men. A glass of wine equals 5 oz (150 mL). Reading food labels  Check the serving size of packaged foods. For foods such as rice and pasta, the serving size refers to the amount of cooked product, not dry.  Check the total fat in packaged foods. Avoid foods that have saturated fat or trans fats.  Check the ingredients list for added sugars, such as corn syrup. Shopping  At the grocery store, buy most of your food from the areas near the walls of the store. This includes: ? Fresh fruits and vegetables (produce). ? Grains, beans, nuts, and seeds. Some of these may be available in unpackaged forms or large amounts (in bulk). ? Fresh seafood. ? Poultry and eggs. ? Low-fat dairy products.  Buy whole ingredients instead of prepackaged foods.  Buy fresh fruits and vegetables in-season from local farmers markets.  Buy frozen fruits and vegetables in resealable bags.  If you do not have access to quality fresh seafood, buy precooked frozen shrimp or canned fish, such as tuna, salmon, or sardines.  Buy small amounts of raw or cooked vegetables, salads, or olives from the  deli or salad bar at your store.  Stock your pantry so you always have certain foods on hand, such as olive oil, canned tuna, canned tomatoes, rice, pasta, and beans. Cooking  Cook foods with extra-virgin olive oil instead of using butter or other vegetable oils.  Have meat as a side dish, and have vegetables or grains as your main dish. This means having meat in small portions or adding small amounts of meat to foods like pasta or stew.  Use beans or vegetables instead of meat in common dishes like chili or lasagna.  Experiment with different cooking methods. Try roasting or broiling vegetables instead of steaming or sauteing them.  Add frozen vegetables to soups, stews, pasta, or rice.  Add nuts or seeds for added healthy fat at each meal. You can add these to yogurt, salads, or vegetable dishes.  Marinate fish or vegetables using olive oil, lemon juice, garlic, and fresh herbs. Meal planning  Plan to eat 1 vegetarian meal one day each week. Try to work up to 2 vegetarian meals, if possible.  Eat seafood 2 or more times a week.  Have healthy snacks readily available, such as: ? Vegetable sticks with hummus. ? Greek yogurt. ? Fruit and nut trail mix.  Eat balanced meals throughout the week. This includes: ? Fruit: 2-3 servings a day ? Vegetables: 4-5 servings a day ? Low-fat dairy: 2 servings a day ? Fish, poultry, or lean meat: 1 serving a day ? Beans and legumes: 2 or more servings a week ? Nuts   and seeds: 1-2 servings a day ? Whole grains: 6-8 servings a day ? Extra-virgin olive oil: 3-4 servings a day  Limit red meat and sweets to only a few servings a month What are my food choices?  Mediterranean diet ? Recommended ? Grains: Whole-grain pasta. Brown rice. Bulgar wheat. Polenta. Couscous. Whole-wheat bread. [place]. ? Vegetables: Artichokes. Beets. Broccoli. Cabbage. Carrots. Eggplant. Green beans. Chard. Kale. Spinach. Onions. Leeks. Peas. Squash.  Tomatoes. Peppers. Radishes. ? Fruits: Apples. Apricots. Avocado. Berries. Bananas. Cherries. Dates. Figs. Grapes. Lemons. Melon. Oranges. Peaches. Plums. Pomegranate. ? Meats and other protein foods: Beans. Almonds. Sunflower seeds. Pine nuts. Peanuts. [place]. Salmon. Scallops. Shrimp. [place]. Tilapia. Clams. Oysters. Eggs. ? Dairy: Low-fat milk. Cheese. Greek yogurt. ? Beverages: Water. Red wine. Herbal tea. ? Fats and oils: Extra virgin olive oil. Avocado oil. Grape seed oil. ? Sweets and desserts: [place] yogurt with honey. Baked apples. Poached pears. Trail mix. ? Seasoning and other foods: Basil. Cilantro. Coriander. Cumin. Mint. Parsley. Sage. Rosemary. Tarragon. Garlic. Oregano. Thyme. Pepper. Balsalmic vinegar. Tahini. Hummus. Tomato sauce. Olives. Mushrooms. ? Limit these ? Grains: Prepackaged pasta or rice dishes. Prepackaged cereal with added sugar. ? Vegetables: Deep fried potatoes (french fries). ? Fruits: Fruit canned in syrup. ? Meats and other protein foods: Beef. Pork. Lamb. Poultry with skin. Hot dogs. [place] Salines. ? Dairy: Ice cream. Sour cream. Whole milk. ? Beverages: Juice. Sugar-sweetened soft drinks. Beer. Liquor and spirits. ? Fats and oils: Butter. Canola oil. Vegetable oil. Beef fat (tallow). Lard. ? Sweets and desserts: Cookies. Cakes. Pies. Candy. ? Seasoning and other foods: Mayonnaise. Premade sauces and marinades. ? The items listed may not be a complete list. Talk with your dietitian about what dietary choices are right for you. Summary  The Mediterranean diet includes both food and lifestyle choices.  Eat a variety of fresh fruits and vegetables, beans, nuts, seeds, and whole grains.  Limit the amount of red meat and sweets that you eat.  Talk with your health care provider about whether it is safe for you to drink red wine in moderation. This means 1 glass a day for nonpregnant women and 2 glasses a day for men. A glass of wine equals 5 oz (150 mL). This information  is not intended to replace advice given to you by your health care provider. Make sure you discuss any questions you have with your health care provider. Document Released: 02/26/2016 Document Revised: 03/30/2016 Document Reviewed: 02/26/2016 Elsevier Interactive Patient Education  2018 [employer] With Attention Deficit Hyperactivity Disorder If you have been diagnosed with attention deficit hyperactivity disorder (ADHD), you may be relieved that you now know why you have felt or behaved a certain way. Still, you may feel overwhelmed about the treatment ahead. You may also wonder how to get the support you need and how to deal with the condition day-to-day. With treatment and support, you can live with ADHD and manage your symptoms. How to manage lifestyle changes Managing stress Stress is your body's reaction to life changes and events, both good and bad. To cope with the stress of an ADHD diagnosis, it may help to:  Learn more about ADHD.  Exercise regularly. Even a short daily walk can lower stress levels.  Participate in training or education programs (including social skills training classes) that teach you to deal with symptoms.  Medicines Your health care provider may suggest certain medicines if he or she feels that they will help to improve your condition.  Stimulant medicines are usually prescribed to treat ADHD, and therapy may also be prescribed. It is important to:  Avoid using alcohol and other substances that may prevent your medicines from working properly [facility]).  Talk with your pharmacist or health care provider about all the medicines that you take, their possible side effects, and what medicines are safe to take together.  Make it your goal to take part in all treatment decisions (shared decision-making). Ask about possible side effects of medicines that your health care provider recommends, and tell him or her how you feel about having those side  effects. It is best if shared decision-making with your health care provider is part of your total treatment plan.  Relationships To strengthen your relationships with family members while treating your condition, consider taking part in family therapy. You might also attend self-help groups alone or with a loved one. Be honest about how your symptoms affect your relationships. Make an effort to communicate respectfully instead of fighting, and find ways to show others that you care. Psychotherapy may be useful in helping you cope with how ADHD affects your relationships. How to recognize changes in your condition The following signs may mean that your treatment is working well and your condition is improving:  Consistently being on time for appointments.  Being more organized at home and work.  Other people noticing improvements in your behavior.  Achieving goals that you set for yourself.  Thinking more clearly.  The following signs may mean that your treatment is not working very well:  Feeling impatience or more confusion.  Missing, forgetting, or being late for appointments.  An increasing sense of disorganization and messiness.  More difficulty in reaching goals that you set for yourself.  Loved ones becoming angry or frustrated with you.  Where to find support Talking to others  Keep emotion out of important discussions and speak in a calm, logical way.  Listen closely and patiently to your loved ones. Try to understand their point of view, and try to avoid getting defensive.  Take responsibility for the consequences of your actions.  Ask that others do not take your behaviors personally.  Aim to solve problems as they come up, and express your feelings instead of bottling them up.  Talk openly about what you need from your loved ones and how they can support you.  Consider going to family therapy sessions or having your family meet with a specialist who deals with  ADHD-related behavior problems. Finances Not all insurance plans cover mental health care, so it is important to check with your insurance carrier. If paying for co-pays or counseling services is a problem, search for a local or county mental health care center. Public mental health care services may be offered there at a low cost or no cost when you are not able to see a private health care provider. If you are taking medicine for ADHD, you may be able to get the generic form, which may be less expensive than brand-name medicine. Some makers of prescription medicines also offer help to patients who cannot afford the medicines that they need. Follow these instructions at home:  Take over-the-counter and prescription medicines only as told by your health care provider. Check with your health care provider before taking any new medicines.  Create structure and an organized atmosphere at home. For example: ? Make a list of tasks, then rank them from most important to least important. Work on one task at a time until  your listed tasks are done. ? Make a daily schedule and follow it consistently every day. ? Use an appointment calendar, and check it 2 or 3 times a day to keep on track. Keep it with you when you leave the house. ? Create spaces where you keep certain things, and always put things back in their places after you use them.  Keep all follow-up visits as told by your health care provider. This is important. Questions to ask your health care provider:  What are the risks and benefits of taking medicines?  Would I benefit from therapy?  How often should I follow up with a health care provider? Contact a health care provider if:  You have side effects from your medicines, such as: ? Repeated muscle twitches, coughing, or speech outbursts. ? Sleep problems. ? Loss of appetite. ? Depression. ? New or worsening behavior problems. ? Dizziness. ? Unusually fast heartbeat. ? Stomach  pains. ? Headaches. Get help right away if:  You have a severe reaction to a medicine.  Your behavior suddenly gets worse. Summary  With treatment and support, you can live with ADHD and manage your symptoms.  The medicines that are most often prescribed for ADHD are stimulants.  Consider taking part in family therapy or self-help groups with family members or friends.  When you talk with friends and family about your ADHD, be patient and communicate openly.  Take over-the-counter and prescription medicines only as told by your health care provider. Check with your health care provider before taking any new medicines. This information is not intended to replace advice given to you by your health care provider. Make sure you discuss any questions you have with your health care provider. Document Released: 11/04/2016 Document Revised: 11/04/2016 Document Reviewed: 11/04/2016 Elsevier Interactive Patient Education  2018 [employer]   Increase water intake, strive for at least  80 ounces/day.   Follow Heart Healthy diet Continue all medications as directed. Okay to refill controlled substances until next follow-up in 6 months. Controlled Substance Contract Completed today. NICE TO SEE YOU!

## 2018-01-03 NOTE — Assessment & Plan Note (Signed)
Increase water intake, strive for at least  80 ounces/day.   Follow Heart Healthy diet Continue all medications as directed. Okay to refill controlled substances until next follow-up in 6 months. Controlled Substance Contract Completed today.

## 2018-01-04 ENCOUNTER — Encounter: Payer: Self-pay | Admitting: Adult Health

## 2018-01-06 NOTE — Progress Notes (Signed)
Error. MPulliam, CMA/RT(R)  

## 2018-02-08 ENCOUNTER — Other Ambulatory Visit: Payer: Self-pay | Admitting: Adult Health

## 2018-02-08 NOTE — Telephone Encounter (Signed)
Patient called states its time for Rx refill on  :  amphetamine-dextroamphetamine (ADDERALL) 15 MG tablet [[id number]][[id number]]   Order Details  Dose, Route, Frequency: As Directed   Dispense Quantity: 30 tablet Refills: 0 Fills remaining: --        Sig: TAKE ONE (1) TABLET BY MOUTH EVERY DAY AT 2PM          Please send to :  [facility] - [place], [place] - [address] [phone] ([phone] (Fax)    Forwarding request to medical assistant.  --Fausto Skillernglh

## 2018-03-12 ENCOUNTER — Other Ambulatory Visit: Payer: Self-pay | Admitting: Adult Health

## 2018-03-13 ENCOUNTER — Telehealth: Payer: Self-pay | Admitting: Adult Health

## 2018-03-13 ENCOUNTER — Other Ambulatory Visit: Payer: Self-pay | Admitting: Adult Health

## 2018-03-13 NOTE — Telephone Encounter (Signed)
Refills sent to pharmacy per William HamburgerKaty Danford, NP.  Tiajuana Amass. Nelson, CMA

## 2018-03-13 NOTE — Telephone Encounter (Signed)
Patient called states is out of both Rx for:  1)---  amphetamine-dextroamphetamine (ADDERALL XR) 10 MG 24 hr capsule [[id number]][[id number]]   Order Details  Dose, Route, Frequency: As Directed   Dispense Quantity: 30 capsule Refills: 0 Fills remaining: --        Sig: TAKE ONE (1) TABLET BY MOUTH EACH DAY     2)--  amphetamine-dextroamphetamine (ADDERALL) 15 MG tablet [[id number]][[id number]]   Order Details  Dose, Route, Frequency: As Directed   Dispense Quantity: 30 tablet Refills: 0 Fills remaining: --        Sig: TAKE ONE (1) TABLET BY MOUTH EACH DAY AT2PM     Please send R refill order to:  [facility] - [place], [place] - [address] [phone] (Phone) [phone] (Fax)   --- Forwarding request to medical assistant.  --Fausto Skillernglh

## 2018-04-08 ENCOUNTER — Other Ambulatory Visit: Payer: Self-pay | Admitting: Adult Health

## 2018-04-12 ENCOUNTER — Other Ambulatory Visit: Payer: Self-pay | Admitting: Adult Health

## 2018-04-13 NOTE — Telephone Encounter (Signed)
[place] Controlled Substance Database reviewed- no aberrancies noted 

## 2018-05-08 ENCOUNTER — Other Ambulatory Visit: Payer: Self-pay | Admitting: Adult Health

## 2018-05-23 ENCOUNTER — Other Ambulatory Visit: Payer: Self-pay | Admitting: Adult Health

## 2018-05-31 ENCOUNTER — Other Ambulatory Visit: Payer: Self-pay | Admitting: Adult Health

## 2018-06-04 ENCOUNTER — Other Ambulatory Visit: Payer: Self-pay | Admitting: Adult Health

## 2018-06-19 ENCOUNTER — Other Ambulatory Visit: Payer: Self-pay | Admitting: Adult Health

## 2018-06-19 ENCOUNTER — Telehealth: Payer: Self-pay | Admitting: Adult Health

## 2018-06-19 NOTE — Telephone Encounter (Signed)
Patient called for Rx refill on these (2) Rx :   1)---- ALPRAZolam (XANAX) 0.25 MG tablet [[id number]][[id number]]   Order Details  Dose, Route, Frequency: As Directed   Dispense Quantity: 20 tablet Refills: 0 Fills remaining: --        Sig: TAKE ONE TABLET BY MOUTH TWICE DAILY AS NEEDED FOR ANXIETY     &  2)------ amphetamine-dextroamphetamine (ADDERALL XR) 10 MG 24 hr capsule [[id number]][[id number]]   Order Details  Dose, Route, Frequency: As Directed   Dispense Quantity: 30 capsule Refills: 0 Fills remaining: --        Sig: TAKE ONE CAPSULE BY MOUTH DAILY     --Forwarding request to medical assistant to send to:   [id number] [place],  - [address] [phone] (Phone) [phone] (Fax)     ---glh

## 2018-06-19 NOTE — Telephone Encounter (Signed)
No aberrancies noted in [place] Controlled substance database.  Ann Christensen. , CMA

## 2018-07-01 ENCOUNTER — Other Ambulatory Visit: Payer: Self-pay | Admitting: Adult Health

## 2018-07-03 NOTE — Progress Notes (Signed)
Subjective:    Patient ID: Ann Christensen, female    DOB: Aug 22, 1985, 32 y.o.   MRN: [id number]  HPI: 03/14/17 OV:  Ms. Carmen is here for regular f/u: ADHD, anxiety, depression, GERD, insomnia, and chronic/diffuse muscle soreness/spasm. She is compliant on all medications and denies SE.  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  She has been trying to increase water intake and eats "whatever I really want b/c I move all day".  She continues to have muscle soreness/aches and visits her chiropractor at least monthly.  She cannot recall her last therapy appt, however reports that her anxiety/depression have been stable.  She continues to avoid tobacco/EOTH use.  06/13/17 OV: Ms. Benegas is here for CPE.  She reports medication compliance and denies SE.  She continues to increase water, est to drink >50 oz/day.  She follows heart healthy diet and moves "all day, everyday" running a horse farm.   She denies tobacco/ETOH use and sleeps very well at night. Her mother requires daily care during her ca treatment, which has been an added stressor. She reports depression/anxiety stable and is now seeing her therapist PRN, last OV 3 months ago. Healthcare Maintenance: PAP-not due until 2019 Mammogram- not indicated Colonoscopy-not indicated  01/03/18 OV:  Ms. 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 She suffered injury approx 3 weeks ago- horse bucked her off and saddle fell onto L thigh, causing large hematoma and muscle tear, records requested from [facility] in [place]. She has been treating injury with rest, TENs unit, muscle relaxers, and OTC NSAIDs. She estimates to drink >75 oz water/day and follows a diet rich in CHO/vegetables/fruits. 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 She reports sleeping well  and denies exacerbation of acute stress/anxeity. She "checks-in" with her therapist Q3M She denies thoughts of harming herself/others She continues to avoid tobacco/ETOH She reports ingesting raw nuts a few weeks and experienced wheezing and "scratchy throat", treated with OTC Benadryl- requests refill on Epi-Pen We discussed her current rx regime and completed Controlled Substance Contract today.  07/05/18 OV: Ms. Pote is here for regular f/u: ADHD, GAD, Depression, Insomnia, chronic muscle spasm She reports medication compliance, denies SE She reports excellent focus/concentration She estimates to drink 60 oz water/day, follows heart healthy diet Continues to abstain from tobacco/vape/excessive ETOH use She is due for annual CPE Has not seen therapist is months, reports very stable mood- denies thoughts of harming herself/others   Patient Care Team    Relationship Specialty Notifications Start End  Julaine Fusi, NP PCP - General Family Medicine  08/09/16   Obgyn, Ma Hillock    06/30/17   [facility], [facility], [facility]  Internal Medicine  06/30/17     Patient Active Problem List   Diagnosis Date Noted  . Multiple food allergies 07/05/2018  . Controlled substance agreement signed 01/03/2018  . Health care maintenance 12/08/2016  . ADHD (attention deficit hyperactivity disorder) 09/09/2016  . Insomnia 09/09/2016  . Depression, recurrent (HCC) 09/09/2016  . Anxiety 09/09/2016  . ASTHMA 09/28/2007  . GERD 09/28/2007  . HIATAL HERNIA 09/28/2007  . IRRITABLE BOWEL SYNDROME 09/28/2007     Past Medical History:  Diagnosis Date  . Anxiety   . Depression      Past Surgical History:  Procedure Laterality Date  . TONSILLECTOMY AND  ADENOIDECTOMY     She was 32 years old  . WISDOM TOOTH EXTRACTION  2011     Family History  Problem Relation Age of Onset  . Hypertension Mother   . Alcohol abuse Father   . Hypertension Father   . Cancer Maternal Aunt        colon  .  Cancer Maternal Grandmother        colon  . Diabetes Paternal Grandmother      Social History   Substance and Sexual Activity  Drug Use Yes  . Frequency: 10.0 times per week  . Types: Marijuana     Social History   Substance and Sexual Activity  Alcohol Use Yes   Comment: Rare     Social History   Tobacco Use  Smoking Status Never Smoker  Smokeless Tobacco Never Used     Outpatient Encounter Medications as of 07/05/2018  Medication Sig  . ALPRAZolam (XANAX) 0.25 MG tablet TAKE ONE (1) TABLET BY MOUTH TWO (2) TIMES DAILY AS NEEDED FOR ANXIETY  . amphetamine-dextroamphetamine (ADDERALL XR) 10 MG 24 hr capsule TAKE ONE CAPSULE BY MOUTH DAILY  . amphetamine-dextroamphetamine (ADDERALL) 15 MG tablet TAKE ONE (1) TABLET BY MOUTH EVERY DAY AT 2PM  . buPROPion (WELLBUTRIN XL) 300 MG 24 hr tablet Take 1 tablet (300 mg total) by mouth daily.  . cloNIDine (CATAPRES) 0.1 MG tablet Take 1 tablet (0.1 mg total) by mouth daily as needed.  . cyclobenzaprine (FLEXERIL) 10 MG tablet TAKE 1 TABLET BY MOUTH 3  TIMES DAILY AS NEEDED FOR  MUSCLE SPASM(S)  . MIBELAS 24 FE 1-20 MG-MCG(24) CHEW CHEW AND SWALLOW 1 TABLET   DAILY  . Multiple Vitamin (MULTIVITAMIN) tablet Take 1 tablet by mouth daily.  [name]. spironolactone (ALDACTONE) 100 MG tablet TAKE 1 TABLET BY MOUTH  DAILY  . [DISCONTINUED] cyclobenzaprine (FLEXERIL) 10 MG tablet TAKE 1 TABLET BY MOUTH 3  TIMES DAILY AS NEEDED FOR  MUSCLE SPASM(S)  . [DISCONTINUED] cyclobenzaprine (FLEXERIL) 10 MG tablet TAKE 1 TABLET BY MOUTH 3  TIMES DAILY AS NEEDED FOR  MUSCLE SPASM(S)  . [DISCONTINUED] cyclobenzaprine (FLEXERIL) 10 MG tablet TAKE 1 TABLET BY MOUTH 3  TIMES DAILY AS NEEDED FOR  MUSCLE SPASM(S)  . albuterol (PROVENTIL HFA;VENTOLIN HFA) 108 (90 Base) MCG/ACT inhaler Inhale 2 puffs into the lungs every 6 (six) hours as needed for wheezing or shortness of breath.   No facility-administered encounter medications on file as of 07/05/2018.      Allergies: Latex  Body mass index is 26.89 kg/m.  Blood pressure 117/83, pulse 77, temperature 98.4 F (36.9 C), temperature source Oral, height 5\' 5"  (1.651 m), weight 161 lb 9.6 oz (73.3 kg), SpO2 100 %.  Review of Systems  Constitutional: Positive for fatigue. Negative for activity change, appetite change, chills, diaphoresis, fever and unexpected weight change.  HENT: Negative for congestion.   Eyes: Negative for visual disturbance.  Respiratory: Negative for cough, chest tightness, shortness of breath, wheezing and stridor.   Cardiovascular: Negative for chest pain and leg swelling.  Gastrointestinal: Negative for abdominal distention, abdominal pain, blood in stool, constipation, diarrhea, nausea and vomiting.  Endocrine: Negative for cold intolerance, heat intolerance, polydipsia, polyphagia and polyuria.  Genitourinary: Negative for difficulty urinating, flank pain and hematuria.  Musculoskeletal: Positive for arthralgias, back pain, joint swelling and myalgias. Negative for gait problem, neck pain and neck stiffness.  Skin: Negative for color change, pallor, rash and wound.  Neurological: Negative for dizziness, tremors, weakness and headaches.  Hematological: Does not bruise/bleed easily.  Psychiatric/Behavioral: Positive for decreased concentration. Negative for dysphoric mood, hallucinations, self-injury, sleep disturbance and suicidal ideas. The patient is not nervous/anxious and is not hyperactive.        Objective:   Physical Exam  Constitutional: She is oriented to person, place, and time. She appears well-developed and well-nourished. No distress.  HENT:  Head: Normocephalic and atraumatic.  Right Ear: Hearing, tympanic membrane, external ear and ear canal normal. Tympanic membrane is not erythematous and not bulging. No decreased hearing is noted.  Left Ear: Hearing, tympanic membrane, external ear and ear canal normal. Tympanic membrane is not erythematous and  not bulging.  Nose: Nose normal. Right sinus exhibits no maxillary sinus tenderness and no frontal sinus tenderness. Left sinus exhibits no maxillary sinus tenderness and no frontal sinus tenderness.  Mouth/Throat: Uvula is midline, oropharynx is clear and moist and mucous membranes are normal.  Eyes: Pupils are equal, round, and reactive to light. Conjunctivae are normal.  Cardiovascular: Normal rate, regular rhythm and normal heart sounds.  No murmur heard. Pulmonary/Chest: Effort normal and breath sounds normal. No respiratory distress. She has no decreased breath sounds. She has no wheezes. She has no rhonchi. She has no rales. She exhibits no tenderness. Right breast exhibits no inverted nipple, no mass, no nipple discharge, no skin change and no tenderness. Left breast exhibits no inverted nipple, no mass, no nipple discharge, no skin change and no tenderness. Breasts are symmetrical.  Musculoskeletal: Normal range of motion.        General: Deformity and edema present. No tenderness.     Left upper leg: She exhibits edema and deformity. She exhibits no tenderness.  Neurological: She is alert and oriented to person, place, and time.  Skin: She is not diaphoretic.  Psychiatric: She has a normal mood and affect. Her behavior is normal. Judgment and thought content normal.  Nursing note and vitals reviewed.     Assessment & Plan:   1. Health care maintenance   2. Multiple food allergies   3. Depression, recurrent (HCC)   4. Anxiety   5. Attention deficit hyperactivity disorder (ADHD), unspecified ADHD type     Health care maintenance Continue all medications as directed. Continue to request refills through your pharmacy when needed. You will need to be seen every 3 months for ADHD medication protocol. Continue to drink plenty of water and follow heart healthy diet. Referral to Allergist placed. Please schedule complete physical with fasting labs in 3 months.  Depression, recurrent  (HCC) Mood stable Denies thoughts of harming herself/others   Anxiety Stable, reports very infrequent use of Alprazolam 0.25mg   ADHD (attention deficit hyperactivity disorder) [place] Controlled Substance Database reviewed-no aberrancies noted OVs every 3 months, continue to request RFs through your pharmacy  Multiple food allergies Referral to Allergist placed Declined RF on Epi-pen at this time   F/u 3 months CPE with fasting labs

## 2018-07-05 ENCOUNTER — Encounter: Payer: Self-pay | Admitting: Adult Health

## 2018-07-05 ENCOUNTER — Ambulatory Visit (INDEPENDENT_AMBULATORY_CARE_PROVIDER_SITE_OTHER): Payer: 59 | Admitting: Adult Health

## 2018-07-05 VITALS — BP 117/83 | HR 77 | Temp 98.4°F | Ht 65.0 in | Wt 161.6 lb

## 2018-07-05 DIAGNOSIS — Z0000 Encounter for general adult medical examination without abnormal findings: Secondary | ICD-10-CM | POA: Diagnosis not present

## 2018-07-05 DIAGNOSIS — F909 Attention-deficit hyperactivity disorder, unspecified type: Secondary | ICD-10-CM

## 2018-07-05 DIAGNOSIS — F339 Major depressive disorder, recurrent, unspecified: Secondary | ICD-10-CM | POA: Diagnosis not present

## 2018-07-05 DIAGNOSIS — Z91018 Allergy to other foods: Secondary | ICD-10-CM | POA: Insufficient documentation

## 2018-07-05 DIAGNOSIS — F419 Anxiety disorder, unspecified: Secondary | ICD-10-CM

## 2018-07-05 DIAGNOSIS — R69 Illness, unspecified: Secondary | ICD-10-CM | POA: Diagnosis not present

## 2018-07-05 HISTORY — DX: Allergy to other foods: Z91.018

## 2018-07-05 MED ORDER — ALBUTEROL SULFATE HFA 108 (90 BASE) MCG/ACT IN AERS: 2.0000 | INHALATION_SPRAY | Freq: Four times a day (QID) | RESPIRATORY_TRACT | 0 refills | Status: DC | PRN

## 2018-07-05 NOTE — Assessment & Plan Note (Signed)
Referral to Allergist placed Declined RF on Epi-pen at this time

## 2018-07-05 NOTE — Assessment & Plan Note (Signed)
Continue all medications as directed. Continue to request refills through your pharmacy when needed. You will need to be seen every 3 months for ADHD medication protocol. Continue to drink plenty of water and follow heart healthy diet. Referral to Allergist placed. Please schedule complete physical with fasting labs in 3 months.

## 2018-07-05 NOTE — Patient Instructions (Addendum)

## 2018-07-05 NOTE — Assessment & Plan Note (Signed)
[place] Controlled Substance Database reviewed-no aberrancies noted OVs every 3 months, continue to request RFs through your pharmacy

## 2018-07-05 NOTE — Assessment & Plan Note (Signed)
Mood stable Denies thoughts of harming herself/others

## 2018-07-05 NOTE — Assessment & Plan Note (Signed)
Stable, reports very infrequent use of Alprazolam 0.25mg 

## 2018-07-26 ENCOUNTER — Other Ambulatory Visit: Payer: Self-pay | Admitting: Adult Health

## 2018-07-28 ENCOUNTER — Other Ambulatory Visit: Payer: Self-pay | Admitting: Adult Health

## 2018-07-28 NOTE — Telephone Encounter (Signed)
Patient called and left VM stating she was calling for her "monthly refills", patient did not specify any meds on the VM.

## 2018-07-31 MED ORDER — AMPHETAMINE-DEXTROAMPHETAMINE 15 MG PO TABS: ORAL_TABLET | ORAL | 0 refills | Status: DC

## 2018-07-31 MED ORDER — AMPHETAMINE-DEXTROAMPHET ER 10 MG PO CP24: 10.0000 mg | ORAL_CAPSULE | Freq: Every day | ORAL | 0 refills | Status: DC

## 2018-07-31 NOTE — Telephone Encounter (Signed)
Pt informed.  T. Lauryn Lizardi, CMA 

## 2018-07-31 NOTE — Addendum Note (Signed)
Addended by: Stan Head on: 07/31/2018 10:58 AM   Modules accepted: Orders

## 2018-07-31 NOTE — Telephone Encounter (Signed)
[place] Controlled Substance Database reviewed.  No aberrancies noted.  T. Nelson, CMA  

## 2018-08-14 ENCOUNTER — Other Ambulatory Visit: Payer: Self-pay

## 2018-08-14 MED ORDER — ALBUTEROL SULFATE HFA 108 (90 BASE) MCG/ACT IN AERS: INHALATION_SPRAY | RESPIRATORY_TRACT | 1 refills | Status: DC

## 2018-08-14 NOTE — Telephone Encounter (Signed)
Received fax requesting albuterol inhaler be sent to mail order pharmacy, rather than local pharmacy.  RX sent.  Tiajuana Amass, CMA

## 2018-08-15 ENCOUNTER — Other Ambulatory Visit: Payer: Self-pay | Admitting: Adult Health

## 2018-08-22 ENCOUNTER — Telehealth: Payer: Self-pay | Admitting: Adult Health

## 2018-08-22 NOTE — Telephone Encounter (Signed)
Per Hazle Quant @ [facility] [phone] patient would like to appeal Ins Co denial of  :  albuterol (VENTOLIN HFA) 108 (90 Base) MCG/ACT inhaler [[id number]]   Order Details  Dose, Route, Frequency: As Directed   Dispense Quantity: 18 Inhaler Refills: 1 Fills remaining: --        Sig: TAKE 2 PUFFS BY MOUTH EVERY 6 HOURS AS NEEDED FOR WHEEZE OR SHORTNESS OF BREATH     Forwarding message to medical assistant to contact them as pharmacy:   [facility] - [place], [place] - [address] [phone] (Phone) [phone] (Fax)   --glh

## 2018-08-22 NOTE — Telephone Encounter (Signed)
Please use [facility] ref# [id number] when calling [phone]

## 2018-08-22 NOTE — Telephone Encounter (Signed)
Pharmacy was actually requesting a refill for ventolin, not appealing an insurance denial.  Advised pharmacy that RX for this medication with 1 additional refill was just sent on 08/14/2018 and confirmed received by their pharmacy.  Therefore, any additional refill request is denied at this time.  Tiajuana Amass, CMA

## 2018-08-29 ENCOUNTER — Other Ambulatory Visit: Payer: Self-pay | Admitting: Adult Health

## 2018-09-01 ENCOUNTER — Other Ambulatory Visit: Payer: Self-pay | Admitting: Adult Health

## 2018-09-01 NOTE — Telephone Encounter (Signed)
Controlled Substance Database reviewed.  No aberrancies noted.  T. Jabree Pernice, CMA  

## 2018-09-01 NOTE — Telephone Encounter (Signed)
Patient states has 1 day supply left of both Rxs & had ask pharmacy to send [facility] refill request (they say cannot due it being a control substance).  ----Forwarding refill request to medical assistant for :   amphetamine-dextroamphetamine (ADDERALL XR) 10 MG 24 hr capsule [[id number]]   Order Details  Dose: 10 mg Route: Oral Frequency: Daily  Dispense Quantity: 30 capsule Refills: 0 Fills remaining: --        Sig: Take 1 capsule (10 mg total) by mouth daily.     &  amphetamine-dextroamphetamine (ADDERALL) 15 MG tablet [[id number]]   Order Details  Dose, Route, Frequency: As Directed   Dispense Quantity: 30 tablet Refills: 0 Fills remaining: --        Sig: One tablet daily at 2pm          ---Please send order to :   [id number] [place], [place] - [address] [phone] (Phone) [phone] (Fax)   ---glh

## 2018-09-04 MED ORDER — AMPHETAMINE-DEXTROAMPHETAMINE 15 MG PO TABS: ORAL_TABLET | ORAL | 0 refills | Status: DC

## 2018-09-04 MED ORDER — AMPHETAMINE-DEXTROAMPHET ER 10 MG PO CP24: 10.0000 mg | ORAL_CAPSULE | Freq: Every day | ORAL | 0 refills | Status: DC

## 2018-09-04 NOTE — Telephone Encounter (Signed)
Pt informed.  T. Izzabella Besse, CMA 

## 2018-09-27 ENCOUNTER — Other Ambulatory Visit: Payer: Self-pay

## 2018-09-27 DIAGNOSIS — Z0000 Encounter for general adult medical examination without abnormal findings: Secondary | ICD-10-CM | POA: Diagnosis not present

## 2018-09-28 LAB — COMPREHENSIVE METABOLIC PANEL
ALK PHOS: 50 IU/L (ref 39–117)
ALT: 16 IU/L (ref 0–32)
AST: 12 IU/L (ref 0–40)
Albumin/Globulin Ratio: 1.7 (ref 1.2–2.2)
Albumin: 4.3 g/dL (ref 3.8–4.8)
BUN/Creatinine Ratio: 9 (ref 9–23)
BUN: 9 mg/dL (ref 6–20)
Bilirubin Total: 0.4 mg/dL (ref 0.0–1.2)
CO2: 20 mmol/L (ref 20–29)
Calcium: 9.6 mg/dL (ref 8.7–10.2)
Chloride: 103 mmol/L (ref 96–106)
Creatinine, Ser: 1 mg/dL (ref 0.57–1.00)
GFR calc Af Amer: 86 mL/min/{1.73_m2} (ref >59)
GFR calc non Af Amer: 74 mL/min/{1.73_m2} (ref >59)
Globulin, Total: 2.5 g/dL (ref 1.5–4.5)
Glucose: 91 mg/dL (ref 65–99)
Potassium: 4.2 mmol/L (ref 3.5–5.2)
SODIUM: 139 mmol/L (ref 134–144)
Total Protein: 6.8 g/dL (ref 6.0–8.5)

## 2018-09-28 LAB — LIPID PANEL
Chol/HDL Ratio: 2.7 ratio (ref 0.0–4.4)
Cholesterol, Total: 181 mg/dL (ref 100–199)
HDL: 67 mg/dL (ref >39)
LDL Calculated: 93 mg/dL (ref 0–99)
Triglycerides: 104 mg/dL (ref 0–149)
VLDL Cholesterol Cal: 21 mg/dL (ref 5–40)

## 2018-09-28 LAB — CBC WITH DIFFERENTIAL/PLATELET
Basophils Absolute: 0 10*3/uL (ref 0.0–0.2)
Basos: 1 %
EOS (ABSOLUTE): 0.4 10*3/uL (ref 0.0–0.4)
Eos: 7 %
HEMOGLOBIN: 13.3 g/dL (ref 11.1–15.9)
Hematocrit: 39.9 % (ref 34.0–46.6)
Immature Grans (Abs): 0 10*3/uL (ref 0.0–0.1)
Immature Granulocytes: 0 %
Lymphocytes Absolute: 2.8 10*3/uL (ref 0.7–3.1)
Lymphs: 54 %
MCH: 32.8 pg (ref 26.6–33.0)
MCHC: 33.3 g/dL (ref 31.5–35.7)
MCV: 98 fL — ABNORMAL HIGH (ref 79–97)
Monocytes Absolute: 0.3 10*3/uL (ref 0.1–0.9)
Monocytes: 5 %
Neutrophils Absolute: 1.8 10*3/uL (ref 1.4–7.0)
Neutrophils: 33 %
Platelets: 339 10*3/uL (ref 150–450)
RBC: 4.06 x10E6/uL (ref 3.77–5.28)
RDW: 12.1 % (ref 11.7–15.4)
WBC: 5.3 10*3/uL (ref 3.4–10.8)

## 2018-09-28 LAB — HEMOGLOBIN A1C
Est. average glucose Bld gHb Est-mCnc: 103 mg/dL
HEMOGLOBIN A1C: 5.2 % (ref 4.8–5.6)

## 2018-09-28 LAB — TSH: TSH: 3.28 u[IU]/mL (ref 0.450–4.500)

## 2018-10-04 ENCOUNTER — Other Ambulatory Visit: Payer: Self-pay | Admitting: Adult Health

## 2018-10-04 ENCOUNTER — Encounter: Payer: 59 | Admitting: Adult Health

## 2018-10-04 MED ORDER — CLONIDINE HCL 0.1 MG PO TABS: 0.1000 mg | ORAL_TABLET | Freq: Every day | ORAL | 0 refills | Status: DC | PRN

## 2018-10-04 MED ORDER — AMPHETAMINE-DEXTROAMPHETAMINE 15 MG PO TABS: ORAL_TABLET | ORAL | 0 refills | Status: DC

## 2018-10-04 MED ORDER — AMPHETAMINE-DEXTROAMPHET ER 10 MG PO CP24: 10.0000 mg | ORAL_CAPSULE | Freq: Every day | ORAL | 0 refills | Status: DC

## 2018-10-04 MED ORDER — ALPRAZOLAM 0.25 MG PO TABS: ORAL_TABLET | ORAL | 0 refills | Status: DC

## 2018-10-04 MED ORDER — NORETHIN ACE-ETH ESTRAD-FE 1-20 MG-MCG(24) PO CHEW: CHEWABLE_TABLET | ORAL | 1 refills | Status: DC

## 2018-10-04 NOTE — Addendum Note (Signed)
Addended by: Stan Head on: 10/04/2018 02:15 PM   Modules accepted: Orders

## 2018-10-04 NOTE — Telephone Encounter (Signed)
Patient is requesting a refill of her Xanax, birth control, both Adderall prescriptions and her clonidine. [facility] [place] [facility]

## 2018-11-15 ENCOUNTER — Other Ambulatory Visit: Payer: Self-pay

## 2018-11-15 ENCOUNTER — Other Ambulatory Visit: Payer: Self-pay | Admitting: Adult Health

## 2018-11-15 MED ORDER — ALPRAZOLAM 0.25 MG PO TABS: ORAL_TABLET | ORAL | 0 refills | Status: DC

## 2018-11-15 MED ORDER — AMPHETAMINE-DEXTROAMPHETAMINE 15 MG PO TABS: ORAL_TABLET | ORAL | 0 refills | Status: DC

## 2018-11-15 MED ORDER — AMPHETAMINE-DEXTROAMPHET ER 10 MG PO CP24: 10.0000 mg | ORAL_CAPSULE | Freq: Every day | ORAL | 0 refills | Status: DC

## 2018-11-15 NOTE — Progress Notes (Signed)
Virtual Visit via Telephone Note  I connected with Ann Christensen on 11/16/2018 at  9:30 AM EDT by telephone and verified that I am speaking with the correct person using two identifiers.  Location: Patient: Home Provider: In Clinic   I discussed the limitations, risks, security and privacy concerns of performing an evaluation and management service by telephone and the availability of in person appointments. The staff discussed with the patient that there may be a patient responsible charge related to this service. The patient expressed understanding and agreed to proceed.   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, "all stalls all full" She has not checked with her therapist "Edson Snowball" in a few months- recommend to have TeleMedicine visit She and her wife have been isolated at home on their farm >6 weeks Her mother is very immunocompromised and the entire family has been very careful to remain at home during the Pandemic- groceries are even being delivered She reports sleeping well She continues to abstain from tobacco/vape/ETOH use   Review of Systems: General:   No F/C, wt loss Pulm:   No DIB, SOB, pleuritic chest pain Card:  No CP, palpitations Abd:  No n/v/d or pain Ext:  No inc edema from baseline This patient does not have sx concerning for COVID-19 Infection (ie; fever, chills, cough, new or worsening shortness of breath).  Patient Care Team    Relationship Specialty Notifications Start End  William Hamburger D, NP PCP - General Family Medicine  08/09/16   Obgyn, Ma Hillock    06/30/17   [facility], [facility]  Internal Medicine  06/30/17     Patient Active Problem List   Diagnosis Date Noted  . Multiple food allergies 07/05/2018  . Controlled substance agreement signed 01/03/2018  . Health care maintenance  12/08/2016  . ADHD (attention deficit hyperactivity disorder) 09/09/2016  . Insomnia 09/09/2016  . Depression, recurrent (HCC) 09/09/2016  . Anxiety 09/09/2016  . ASTHMA 09/28/2007  . GERD 09/28/2007  . HIATAL HERNIA 09/28/2007  . IRRITABLE BOWEL SYNDROME 09/28/2007     Past Medical History:  Diagnosis Date  . Anxiety   . Depression      Past Surgical History:  Procedure Laterality Date  . TONSILLECTOMY AND ADENOIDECTOMY     She was 33 years old  . WISDOM TOOTH EXTRACTION  2011     Family History  Problem Relation Age of Onset  . Hypertension Mother   . Alcohol abuse Father   . Hypertension Father   . Cancer Maternal Aunt        colon  . Cancer Maternal Grandmother        colon  . Diabetes Paternal Grandmother      Social History   Substance and Sexual Activity  Drug Use Yes  . Frequency: 10.0 times per week  . Types: Marijuana     Social History   Substance and Sexual Activity  Alcohol Use Yes   Comment: Rare     Social History   Tobacco Use  Smoking Status Never Smoker  Smokeless Tobacco Never Used     Outpatient Encounter Medications as of 11/16/2018  Medication Sig  . albuterol (VENTOLIN HFA) 108 (90 Base) MCG/ACT inhaler USE 2 PUFFS BY MOUTH EVERY  4 TO 6 HOURS AS NEEDED FOR  WHEEZE OR SHORTNESS OF  BREATH  . ALPRAZolam (  XANAX) 0.25 MG tablet TAKE ONE (1) TABLET BY MOUTH TWO (2) TIMES DAILY AS NEEDED FOR ANXIETY  . amphetamine-dextroamphetamine (ADDERALL XR) 10 MG 24 hr capsule Take 1 capsule (10 mg total) by mouth daily.  [name]. amphetamine-dextroamphetamine (ADDERALL) 15 MG tablet One tablet daily at 2pm  . buPROPion (WELLBUTRIN XL) 300 MG 24 hr tablet Take 1 tablet (300 mg total) by mouth daily.  . cloNIDine (CATAPRES) 0.1 MG tablet Take 1 tablet (0.1 mg total) by mouth daily as needed.  . cyclobenzaprine (FLEXERIL) 10 MG tablet TAKE 1 TABLET BY MOUTH 3  TIMES DAILY AS NEEDED FOR  MUSCLE SPASM(S)  . Multiple Vitamin (MULTIVITAMIN) tablet Take  1 tablet by mouth daily.  . Norethin Ace-Eth Estrad-FE (MIBELAS 24 FE) 1-20 MG-MCG(24) CHEW CHEW AND SWALLOW 1 TABLET   DAILY  . spironolactone (ALDACTONE) 100 MG tablet TAKE 1 TABLET BY MOUTH  DAILY  . [DISCONTINUED] ALPRAZolam (XANAX) 0.25 MG tablet TAKE ONE (1) TABLET BY MOUTH TWO (2) TIMES DAILY AS NEEDED FOR ANXIETY  . [DISCONTINUED] amphetamine-dextroamphetamine (ADDERALL XR) 10 MG 24 hr capsule Take 1 capsule (10 mg total) by mouth daily.  . [DISCONTINUED] amphetamine-dextroamphetamine (ADDERALL) 15 MG tablet One tablet daily at 2pm   No facility-administered encounter medications on file as of 11/16/2018.     Allergies: Latex  There is no height or weight on file to calculate BMI.  Blood pressure (!) 85/70, pulse 68, temperature 98 F (36.7 C), temperature source Oral. She is using wrist BP cuff and received several very high/low readings during the encounter. She denies CP/dizziness/palpitations.  Observations/Objective: No acute distress noted during the telephone converstation  Assessment and Plan: Continue all medications as directed. Increase water intake, follow Mediterranean diet Resume regular follow-up with therapist  Signs and symptoms of COVID-19 infection were discussed with pt and how to seek care for testing.  The importance of following the Stay at Home order, and when out- Social Distancing and wearing a facial mask were discussed today.  Follow Up Instructions: Telemedicine encounter 3 months CPE 6 months COVID-19 Education: I discussed the assessment and treatment plan with the patient. The patient was provided an opportunity to ask questions and all were answered. The patient agreed with the plan and demonstrated an understanding of the instructions.   The patient was advised to call back or seek an in-person evaluation if the symptoms worsen or if the condition fails to improve as anticipated.  I provided 24 minutes of non-face-to-face time during this  encounter.   Julaine FusiKaty D Gregg Winchell, NP

## 2018-11-15 NOTE — Telephone Encounter (Signed)
Patient has appt 11/16/2018.  Tiajuana Amass, CMA

## 2018-11-15 NOTE — Telephone Encounter (Signed)
Pt called stating that [facility] [place].  I have called [facility] and cancelled RXs sent earlier today.  Please resend RXs to correct pharmacy.  Tiajuana Amass, CMA

## 2018-11-15 NOTE — Addendum Note (Signed)
Addended by: Stan Head on: 11/15/2018 11:57 AM   Modules accepted: Orders

## 2018-11-15 NOTE — Telephone Encounter (Signed)
Patient is requesting a refill of both her Adderall prescriptions and her Xanax. She states that it must be placed as generic only (as insurance only covers the generic version of the drugs). [facility] [place]

## 2018-11-15 NOTE — Telephone Encounter (Signed)
Good Afternoon Ann Christensen, I am happy to refill, she just need Telemedicine appt next week (last OV 06/2018, instructed to f/u 09/2018) Please call her to make TeleMed OV, then I will refill. Thanks! Orpha Bur

## 2018-11-16 ENCOUNTER — Other Ambulatory Visit: Payer: Self-pay

## 2018-11-16 ENCOUNTER — Encounter: Payer: Self-pay | Admitting: Adult Health

## 2018-11-16 ENCOUNTER — Ambulatory Visit (INDEPENDENT_AMBULATORY_CARE_PROVIDER_SITE_OTHER): Payer: 59 | Admitting: Adult Health

## 2018-11-16 DIAGNOSIS — F339 Major depressive disorder, recurrent, unspecified: Secondary | ICD-10-CM

## 2018-11-16 DIAGNOSIS — F909 Attention-deficit hyperactivity disorder, unspecified type: Secondary | ICD-10-CM

## 2018-11-16 DIAGNOSIS — G4700 Insomnia, unspecified: Secondary | ICD-10-CM | POA: Diagnosis not present

## 2018-11-16 DIAGNOSIS — R69 Illness, unspecified: Secondary | ICD-10-CM | POA: Diagnosis not present

## 2018-11-16 NOTE — Assessment & Plan Note (Signed)
Assessment and Plan: Continue all medications as directed. Increase water intake, follow Mediterranean diet Resume regular follow-up with therapist  Signs and symptoms of COVID-19 infection were discussed with pt and how to seek care for testing.  The importance of following the Stay at Home order, and when out- Social Distancing and wearing a facial mask were discussed today.  Follow Up Instructions: Telemedicine encounter 3 months CPE 6 months COVID-19 Education: I discussed the assessment and treatment plan with the patient. The patient was provided an opportunity to ask questions and all were answered. The patient agreed with the plan and demonstrated an understanding of the instructions.   The patient was advised to call back or seek an in-person evaluation if the symptoms worsen or if the condition fails to improve as anticipated.

## 2018-11-16 NOTE — Assessment & Plan Note (Signed)
[place] Controlled Substance Database reviewed- no aberrancies noted 

## 2018-11-16 NOTE — Assessment & Plan Note (Signed)
Well controlled Remain as active as possible

## 2018-11-28 ENCOUNTER — Telehealth: Payer: Self-pay | Admitting: Adult Health

## 2018-11-28 MED ORDER — SPIRONOLACTONE 100 MG PO TABS: 100.0000 mg | ORAL_TABLET | Freq: Every day | ORAL | 1 refills | Status: DC

## 2018-11-28 NOTE — Addendum Note (Signed)
Addended by: Stan Head on: 11/28/2018 10:33 AM   Modules accepted: Orders

## 2018-11-28 NOTE — Telephone Encounter (Signed)
Patient is requesting a refill of her spironolactone, if approved [facility] [place] [facility] [address]

## 2018-12-20 ENCOUNTER — Other Ambulatory Visit: Payer: Self-pay | Admitting: Adult Health

## 2018-12-20 NOTE — Telephone Encounter (Signed)
Patient called to request refill on these Rx :  1)--- amphetamine-dextroamphetamine (ADDERALL XR) 10 MG 24 hr capsule [[id number]]   Order Details  Dose: 10 mg Route: Oral Frequency: Daily  Dispense Quantity: 30 capsule Refills: 0 Fills remaining: --        Sig: Take 1 capsule (10 mg total) by mouth daily.          2)---- amphetamine-dextroamphetamine (ADDERALL) 15 MG tablet [[id number]]   Order Details  Dose, Route, Frequency: As Directed   Dispense Quantity: 30 tablet Refills: 0 Fills remaining: --        Sig: One tablet daily at 2pm          3)---- Norethin Ace-Eth Estrad-FE (MIBELAS 24 FE) 1-20 MG-MCG(24) CHEW [[id number]]   Order Details  Dose, Route, Frequency: As Directed   Dispense Quantity: 84 tablet Refills: 1 Fills remaining: --        Sig: CHEW AND SWALLOW 1 TABLET  DAILY      ---Forwarding refill request to medical assistant to send to :   Preferred Pharmacies      [id number] [place], [place] - [address] [phone] (Phone) [phone] (Fax)   --glh

## 2018-12-21 MED ORDER — NORETHIN ACE-ETH ESTRAD-FE 1-20 MG-MCG(24) PO CHEW: CHEWABLE_TABLET | ORAL | 1 refills | Status: DC

## 2018-12-21 MED ORDER — AMPHETAMINE-DEXTROAMPHETAMINE 15 MG PO TABS: ORAL_TABLET | ORAL | 0 refills | Status: DC

## 2018-12-21 MED ORDER — AMPHETAMINE-DEXTROAMPHET ER 10 MG PO CP24: 10.0000 mg | ORAL_CAPSULE | Freq: Every day | ORAL | 0 refills | Status: DC

## 2018-12-21 NOTE — Telephone Encounter (Signed)
LOV 11/16/2018.  Adderrall last filled 11/15/2018 and BC last filled 10/04/2018 with 1 RF. MPulliam, CMA/RT(R)

## 2018-12-27 ENCOUNTER — Other Ambulatory Visit: Payer: Self-pay | Admitting: Adult Health

## 2019-01-23 ENCOUNTER — Other Ambulatory Visit: Payer: Self-pay | Admitting: Adult Health

## 2019-01-23 MED ORDER — ALPRAZOLAM 0.25 MG PO TABS: ORAL_TABLET | ORAL | 0 refills | Status: DC

## 2019-01-23 MED ORDER — AMPHETAMINE-DEXTROAMPHETAMINE 15 MG PO TABS: ORAL_TABLET | ORAL | 0 refills | Status: DC

## 2019-01-23 MED ORDER — AMPHETAMINE-DEXTROAMPHET ER 10 MG PO CP24: 10.0000 mg | ORAL_CAPSULE | Freq: Every day | ORAL | 0 refills | Status: DC

## 2019-01-23 NOTE — Addendum Note (Signed)
Addended by: Fonnie Mu on: 01/23/2019 01:25 PM   Modules accepted: Orders

## 2019-01-23 NOTE — Telephone Encounter (Signed)
Patient is requesting a refill of both Adderall prescriptions and her Xanax too. If approved [facility] [place] [facility] [address]

## 2019-02-14 ENCOUNTER — Encounter: Payer: Self-pay | Admitting: Adult Health

## 2019-02-14 ENCOUNTER — Other Ambulatory Visit: Payer: Self-pay

## 2019-02-14 ENCOUNTER — Ambulatory Visit (INDEPENDENT_AMBULATORY_CARE_PROVIDER_SITE_OTHER): Payer: 59 | Admitting: Adult Health

## 2019-02-14 DIAGNOSIS — F339 Major depressive disorder, recurrent, unspecified: Secondary | ICD-10-CM | POA: Diagnosis not present

## 2019-02-14 DIAGNOSIS — R69 Illness, unspecified: Secondary | ICD-10-CM | POA: Diagnosis not present

## 2019-02-14 NOTE — Progress Notes (Signed)
Virtual Visit via Telephone Note  I connected with Ann Christensen on 02/14/19 at  9:30 AM EDT by telephone and verified that I am speaking with the correct person using two identifiers.  Location: Patient: Home Provider: In Clinic   I discussed the limitations, risks, security and privacy concerns of performing an evaluation and management service by telephone and the availability of in person appointments. I also discussed with the patient that there may be a patient responsible charge related to this service. The patient expressed understanding and agreed to proceed.   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, "all stalls all full" She has not checked with her therapist "Edwena Blow" in a few months- recommend to have TeleMedicine visit She and her wife have been isolated at home on their farm >6 weeks Her mother is very immunocompromised and the entire family has been very careful to remain at home during the Pandemic- groceries are even being delivered She reports sleeping well She continues to abstain from tobacco/vape/ETOH use  02/14/2019 OV: Ann Christensen calls in for 3 month f/u: ADD, GAD, Depression,Insomnia She reports medication compliance, denies SE She reports stable mood, denies SI/HI She has limited to one soda per day She has been trying to drink water all day, unsure of oz/day She has been following Heart Healthy Diet, denies tobacco/vape/ETOH use She reports excellent sleep She has been consistently socially distancing and wearing a mask when in public. She participated in her first [place] 2 [place], [place]- received multiple awards. The show adhered to strict safety standards, she is not demonstrating any COVID symptoms  She has not seen her therapist in >1 year, she states "I  just haven't needed her" Recommended that she send her therapist send an email to least check in with her/update her all the good things going on in her  Patient Care Team    Relationship Specialty Notifications Start End  Esaw Grandchild, NP PCP - General Family Medicine  08/09/16   Obgyn, [facility] Conte    06/30/17   [place]  Internal Medicine  06/30/17     Patient Active Problem List   Diagnosis Date Noted  . Multiple food allergies 07/05/2018  . Controlled substance agreement signed 01/03/2018  . Health care maintenance 12/08/2016  . ADHD (attention deficit hyperactivity disorder) 09/09/2016  . Insomnia 09/09/2016  . Depression, recurrent ([place]) 09/09/2016  . Anxiety 09/09/2016  . ASTHMA 09/28/2007  . GERD 09/28/2007  . HIATAL HERNIA 09/28/2007  . IRRITABLE BOWEL SYNDROME 09/28/2007     Past Medical History:  Diagnosis Date  . Anxiety   . Depression      Past Surgical History:  Procedure Laterality Date  . TONSILLECTOMY AND ADENOIDECTOMY     She was 33 years old  . WISDOM TOOTH EXTRACTION  2011     Family History  Problem Relation Age of Onset  . Hypertension Mother   . Alcohol abuse Father   . Hypertension Father   . Cancer Maternal Aunt        colon  . Cancer Maternal Grandmother        colon  . Diabetes Paternal Grandmother      Social History   Substance and Sexual Activity  Drug Use Yes  . Frequency: 10.0 times per week  .  Types: Marijuana     Social History   Substance and Sexual Activity  Alcohol Use Yes   Comment: Rare     Social History   Tobacco Use  Smoking Status Never Smoker  Smokeless Tobacco Never Used     Outpatient Encounter Medications as of 02/14/2019  Medication Sig  . albuterol (VENTOLIN HFA) 108 (90 Base) MCG/ACT inhaler USE 2 PUFFS BY MOUTH EVERY  4 TO 6 HOURS AS NEEDED FOR  WHEEZE OR SHORTNESS OF  BREATH  . ALPRAZolam (XANAX) 0.25 MG tablet TAKE ONE (1) TABLET BY MOUTH TWO (2) TIMES DAILY AS NEEDED  FOR ANXIETY  . amphetamine-dextroamphetamine (ADDERALL XR) 10 MG 24 hr capsule Take 1 capsule (10 mg total) by mouth daily.  [name]. amphetamine-dextroamphetamine (ADDERALL) 15 MG tablet One tablet daily at 2pm  . buPROPion (WELLBUTRIN XL) 300 MG 24 hr tablet Take 1 tablet (300 mg total) by mouth daily.  . cloNIDine (CATAPRES) 0.1 MG tablet TAKE 1 TABLET (0.1 MG TOTAL) BY MOUTH DAILY AS NEEDED.  [name]. cyclobenzaprine (FLEXERIL) 10 MG tablet TAKE 1 TABLET BY MOUTH 3  TIMES DAILY AS NEEDED FOR  MUSCLE SPASM(S)  . Multiple Vitamin (MULTIVITAMIN) tablet Take 1 tablet by mouth daily.  . Norethin Ace-Eth Estrad-FE (MIBELAS 24 FE) 1-20 MG-MCG(24) CHEW CHEW AND SWALLOW 1 TABLET   DAILY  . spironolactone (ALDACTONE) 100 MG tablet Take 1 tablet (100 mg total) by mouth daily.   No facility-administered encounter medications on file as of 02/14/2019.     Allergies: Latex  There is no height or weight on file to calculate BMI.  Temperature 97.7 F (36.5 C), temperature source Oral. Review of Systems: General:   Denies fever, chills, unexplained weight loss.  Optho/Auditory:   Denies visual changes, blurred vision/LOV Respiratory:   Denies SOB, DOE more than baseline levels.  Cardiovascular:   Denies chest pain, palpitations, new onset peripheral edema  Gastrointestinal:   Denies nausea, vomiting, diarrhea.  Genitourinary: Denies dysuria, freq/ urgency, flank pain or discharge from genitals.  Endocrine:     Denies hot or cold intolerance, polyuria, polydipsia. Musculoskeletal:   Denies unexplained myalgias, joint swelling, unexplained arthralgias, gait problems.  Skin:  Denies rash, suspicious lesions Neurological:     Denies dizziness, unexplained weakness, numbness  Psychiatric/Behavioral:   Denies mood changes, suicidal or homicidal ideations, hallucinations This patient does not have sx concerning for COVID-19 Infection (ie; fever, chills, cough, new or worsening shortness of  breath).  Observations/Objective: No acute distress noted during the telephone conversation  Assessment and Plan: Continue all medications as directed [place] Controlled Substance Database reviewed- no aberrancies Remain well hydrated, follow Mediterranean diet Continue active lifestyle Continue to social distance and wear a mask when out in public  Follow Up Instructions: 3 month OV    I discussed the assessment and treatment plan with the patient. The patient was provided an opportunity to ask questions and all were answered. The patient agreed with the plan and demonstrated an understanding of the instructions.   The patient was advised to call back or seek an in-person evaluation if the symptoms worsen or if the condition fails to improve as anticipated.  I provided 18 minutes of non-face-to-face time during this encounter.   Julaine FusiKaty D , NP

## 2019-02-14 NOTE — Assessment & Plan Note (Signed)
Assessment and Plan: Continue all medications as directed [place] Controlled Substance Database reviewed- no aberrancies Remain well hydrated, follow Mediterranean diet Continue active lifestyle Continue to social distance and wear a mask when out in public  Follow Up Instructions: 3 month OV    I discussed the assessment and treatment plan with the patient. The patient was provided an opportunity to ask questions and all were answered. The patient agreed with the plan and demonstrated an understanding of the instructions.

## 2019-02-26 ENCOUNTER — Other Ambulatory Visit: Payer: Self-pay | Admitting: Adult Health

## 2019-02-26 ENCOUNTER — Telehealth: Payer: Self-pay | Admitting: Adult Health

## 2019-02-26 MED ORDER — BUPROPION HCL ER (XL) 300 MG PO TB24: 300.0000 mg | ORAL_TABLET | Freq: Every day | ORAL | 3 refills | Status: DC

## 2019-02-26 MED ORDER — AMPHETAMINE-DEXTROAMPHET ER 10 MG PO CP24: 10.0000 mg | ORAL_CAPSULE | Freq: Every day | ORAL | 0 refills | Status: DC

## 2019-02-26 MED ORDER — ALPRAZOLAM 0.25 MG PO TABS: ORAL_TABLET | ORAL | 0 refills | Status: DC

## 2019-02-26 MED ORDER — AMPHETAMINE-DEXTROAMPHETAMINE 15 MG PO TABS: ORAL_TABLET | ORAL | 0 refills | Status: DC

## 2019-02-26 NOTE — Addendum Note (Signed)
Addended by: Fonnie Mu on: 02/26/2019 09:03 AM   Modules accepted: Orders

## 2019-02-26 NOTE — Telephone Encounter (Signed)
Patient is requesting a refill of her Xanax, both Adderall prescriptions, and her Wellbutrin. If [facility] [place] [facility] °

## 2019-02-26 NOTE — Telephone Encounter (Signed)
Patient is requesting a refill of her Xanax, both Adderall prescriptions, and her Wellbutrin. If approved [facility] [place] [facility]

## 2019-03-06 ENCOUNTER — Other Ambulatory Visit: Payer: Self-pay | Admitting: Adult Health

## 2019-03-06 MED ORDER — NORETHIN ACE-ETH ESTRAD-FE 1-20 MG-MCG(24) PO CHEW: CHEWABLE_TABLET | ORAL | 1 refills | Status: DC

## 2019-03-24 ENCOUNTER — Other Ambulatory Visit: Payer: Self-pay | Admitting: Adult Health

## 2019-04-02 ENCOUNTER — Other Ambulatory Visit: Payer: Self-pay | Admitting: Adult Health

## 2019-04-02 MED ORDER — ALPRAZOLAM 0.25 MG PO TABS: ORAL_TABLET | ORAL | 0 refills | Status: DC

## 2019-04-02 MED ORDER — AMPHETAMINE-DEXTROAMPHET ER 10 MG PO CP24: 10.0000 mg | ORAL_CAPSULE | Freq: Every day | ORAL | 0 refills | Status: DC

## 2019-04-02 MED ORDER — AMPHETAMINE-DEXTROAMPHETAMINE 15 MG PO TABS: ORAL_TABLET | ORAL | 0 refills | Status: DC

## 2019-04-02 NOTE — Addendum Note (Signed)
Addended by: Fonnie Mu on: 04/02/2019 02:15 PM   Modules accepted: Orders

## 2019-04-02 NOTE — Telephone Encounter (Signed)
Patient called requesting a refill of both of her Adderall prescriptions and the Xanax too. If approved [facility] [place].

## 2019-05-10 ENCOUNTER — Other Ambulatory Visit: Payer: Self-pay | Admitting: Adult Health

## 2019-05-10 ENCOUNTER — Telehealth: Payer: Self-pay | Admitting: Adult Health

## 2019-05-10 MED ORDER — AMPHETAMINE-DEXTROAMPHETAMINE 15 MG PO TABS: ORAL_TABLET | ORAL | 0 refills | Status: DC

## 2019-05-10 MED ORDER — AMPHETAMINE-DEXTROAMPHET ER 10 MG PO CP24: 10.0000 mg | ORAL_CAPSULE | Freq: Every day | ORAL | 0 refills | Status: DC

## 2019-05-10 NOTE — Telephone Encounter (Signed)
Forwarding request to medical assistant for pt's refill of :   1)----amphetamine-dextroamphetamine (ADDERALL XR) 10 MG 24 hr capsule [[id number]]   Order Details Dose: 10 mg Route: Oral Frequency: Daily  Dispense Quantity: 30 capsule Refills: 0 Fills remaining: --        Sig: Take 1 capsule (10 mg total) by mouth daily.          &   amphetamine-dextroamphetamine (ADDERALL) 15 MG tablet [[id number]]   Order Details Dose, Route, Frequency: As Directed  Dispense Quantity: 30 tablet Refills: 0 Fills remaining: --        Sig: One tablet daily at 2pm     --If approved send to :   [id number] [place], [place] - [address] [phone] (Phone) [phone] (Fax)   --glh

## 2019-05-16 NOTE — Progress Notes (Signed)
Virtual Visit via Telephone Note  I connected with Ann Christensen on 05/17/2019 at 10:45 AM EDT by telephone and verified that I am speaking with the correct person using two identifiers.  Location: Patient: Home Provider:In Clinic   I discussed the limitations, risks, security and privacy concerns of performing an evaluation and management service by telephone and the availability of in person appointments. I also discussed with the patient that there may be a patient responsible charge related to this service. The patient expressed understanding and agreed to proceed.   History of Present Illness: 11/16/2018 OV: Ms. 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 Ann Christensen calls in for 3 month f/u: ADD, GAD, Depression,Insomnia She reports medication compliance, denies SE She reports stable mood, denies SI/HI She has limited to one soda per day She has been trying to drink water all day, unsure of oz/day She has been following Heart Healthy Diet, denies tobacco/vape/ETOH use She reports excellent sleep She has been consistently socially distancing and wearing a mask when in public. She participated in her first [place] 2 [place] ago, [place]- received multiple awards. The show adhered to strict safety standards, she is not demonstrating any COVID symptoms  She has not seen her therapist in >1 year, she states "I  just haven't needed her" Recommended that she send her therapist send an email to least check in with her/update her all the good things going on in her   05/17/2019 OV: Ms. Ann Christensen is calling in for 3 month f/u: ADHD, Depression, GAD, Insomnia She reports using 1-2 Alprazolam 0.25mg  per week for acute anxiety or insomnia. PDMP Review she is refilling Alprazolam 0.25mg  20 count every 1-3 months. She reports er ADHD is well controlled on Adderall XR 10mg  each morning and Adderall 15mg  at 1400 each afternoon. PDMP review- she is filling these medications Q4-5 weeks She has been using Clonidine 0.1mg  PRN for additional focus control- recommend weaning off this medication. She reports ambulatory BP SBP DBP She denies CP/chest pressure/palpitations/HA/dizziness. She continues to abstain from tobacco/vape/ETOH use She has been trying to increase plain water intake. She reports that her mood is stable, denies SI/HI. She is planning on "checking-in" with her therapist in the next few weeks, when "things quiet down at the farm. 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 She is on Wellbutrin XL 300mg  QD    Needs CPE-over due on PAP- will offer referral to OB/GYN   Patient Care Team    Relationship Specialty Notifications Start End  , NP PCP - General Family Medicine  08/09/16   Obgyn,    06/30/17   [facility]  Internal Medicine  06/30/17     Patient Active Problem List   Diagnosis Date Noted  . Multiple food allergies 07/05/2018  . Controlled substance agreement signed 01/03/2018  . Health care maintenance 12/08/2016  . ADHD (attention deficit  hyperactivity disorder) 09/09/2016  . Insomnia 09/09/2016  . Depression, recurrent ([facility]) 09/09/2016  . Anxiety 09/09/2016  . ASTHMA 09/28/2007  . GERD 09/28/2007  . HIATAL HERNIA 09/28/2007  . IRRITABLE BOWEL SYNDROME 09/28/2007     Past Medical History:  Diagnosis Date  .  Anxiety   . Depression      Past Surgical History:  Procedure Laterality Date  . TONSILLECTOMY AND ADENOIDECTOMY     She was 33 years old  . WISDOM TOOTH EXTRACTION  2011     Family History  Problem Relation Age of Onset  . Hypertension Mother   . Alcohol abuse Father   . Hypertension Father   . Cancer Maternal Aunt        colon  . Cancer Maternal Grandmother        colon  . Diabetes Paternal Grandmother      Social History   Substance and Sexual Activity  Drug Use Yes  . Frequency: 10.0 times per week  . Types: Marijuana     Social History   Substance and Sexual Activity  Alcohol Use Yes   Comment: Rare     Social History   Tobacco Use  Smoking Status Never Smoker  Smokeless Tobacco Never Used     Outpatient Encounter Medications as of 05/17/2019  Medication Sig  . albuterol (VENTOLIN HFA) 108 (90 Base) MCG/ACT inhaler USE 2 PUFFS BY MOUTH EVERY  4 TO 6 HOURS AS NEEDED FOR  WHEEZE OR SHORTNESS OF  BREATH  . ALPRAZolam (XANAX) 0.25 MG tablet TAKE ONE (1) TABLET BY MOUTH TWO (2) TIMES DAILY AS NEEDED FOR ANXIETY  . amphetamine-dextroamphetamine (ADDERALL XR) 10 MG 24 hr capsule Take 1 capsule (10 mg total) by mouth daily.  [name]. amphetamine-dextroamphetamine (ADDERALL) 15 MG tablet One tablet daily at 2pm  . buPROPion (WELLBUTRIN XL) 300 MG 24 hr tablet Take 1 tablet (300 mg total) by mouth daily.  . cloNIDine (CATAPRES) 0.1 MG tablet TAKE 1 TABLET (0.1 MG TOTAL) BY MOUTH DAILY AS NEEDED.  [name]. cyclobenzaprine (FLEXERIL) 10 MG tablet TAKE 1 TABLET BY MOUTH 3  TIMES DAILY AS NEEDED FOR  MUSCLE SPASM(S)  . Multiple Vitamin (MULTIVITAMIN) tablet Take 1 tablet by mouth daily.  . Norethin Ace-Eth Estrad-FE (MIBELAS 24 FE) 1-20 MG-MCG(24) CHEW CHEW AND SWALLOW 1 TABLET   DAILY  . spironolactone (ALDACTONE) 100 MG tablet Take 1 tablet (100 mg total) by mouth daily.   No facility-administered encounter medications on file as of 05/17/2019.      Allergies: Latex  There is no height or weight on file to calculate BMI.  There were no vitals taken for this visit. Review of Systems: General:   Denies fever, chills, unexplained weight loss.  Optho/Auditory:   Denies visual changes, blurred vision/LOV Respiratory:   Denies SOB, DOE more than baseline levels.  Cardiovascular:   Denies chest pain, palpitations, new onset peripheral edema  Gastrointestinal:   Denies nausea, vomiting, diarrhea.  Genitourinary: Denies dysuria, freq/ urgency, flank pain or discharge from genitals.  Endocrine:     Denies hot or cold intolerance, polyuria, polydipsia. Musculoskeletal:   Denies unexplained myalgias, joint swelling, unexplained arthralgias, gait problems.  Skin:  Denies rash, suspicious lesions Neurological:     Denies dizziness, unexplained weakness, numbness  Psychiatric/Behavioral:   Denies mood changes, suicidal or homicidal ideations, hallucinations This patient does not have sx concerning for COVID-19 Infection (ie; fever, chills, cough, new or worsening shortness of breath).   Observations/Objective: No acute distress noted during  the telephone conversation  Assessment and Plan: Continue all medications as directed, recommend weaning off Clonidine 0.1mg  PRN- has been using for ADHD sx control. Encourage more frequent contact with therapist Increase plain water intake, strive to drink half of your body weight in ounces per day. Follow Mediterranean diet Remain active PAP is past due Follow Up Instructions: 3 months   I discussed the assessment and treatment plan with the patient. The patient was provided an opportunity to ask questions and all were answered. The patient agreed with the plan and demonstrated an understanding of the instructions.   The patient was advised to call back or seek an in-person evaluation if the symptoms worsen or if the condition fails to improve as anticipated.  I provided 15  minutes of  non-face-to-face time during this encounter.   Esaw Grandchild, NP

## 2019-05-17 ENCOUNTER — Encounter: Payer: Self-pay | Admitting: Adult Health

## 2019-05-17 ENCOUNTER — Ambulatory Visit (INDEPENDENT_AMBULATORY_CARE_PROVIDER_SITE_OTHER): Payer: 59 | Admitting: Adult Health

## 2019-05-17 ENCOUNTER — Other Ambulatory Visit: Payer: Self-pay

## 2019-05-17 DIAGNOSIS — R69 Illness, unspecified: Secondary | ICD-10-CM | POA: Diagnosis not present

## 2019-05-17 DIAGNOSIS — F339 Major depressive disorder, recurrent, unspecified: Secondary | ICD-10-CM | POA: Diagnosis not present

## 2019-05-17 DIAGNOSIS — L700 Acne vulgaris: Secondary | ICD-10-CM

## 2019-05-17 DIAGNOSIS — Z0000 Encounter for general adult medical examination without abnormal findings: Secondary | ICD-10-CM | POA: Diagnosis not present

## 2019-05-17 DIAGNOSIS — F909 Attention-deficit hyperactivity disorder, unspecified type: Secondary | ICD-10-CM | POA: Diagnosis not present

## 2019-05-17 HISTORY — DX: Acne vulgaris: L70.0

## 2019-05-17 NOTE — Assessment & Plan Note (Signed)
She reports that her mood is stable, denies SI/HI. She is planning on "checking-in" with her therapist in the next few weeks, when "things quiet down at the farm. 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 She is on Wellbutrin XL 300mg  QD

## 2019-05-17 NOTE — Assessment & Plan Note (Signed)
Assessment and Plan: Continue all medications as directed, recommend weaning off Clonidine 0.1mg  PRN- has been using for ADHD sx control. Encourage more frequent contact with therapist Increase plain water intake, strive to drink half of your body weight in ounces per day. Follow Mediterranean diet Remain active PAP is past due Follow Up Instructions: 3 months   I discussed the assessment and treatment plan with the patient. The patient was provided an opportunity to ask questions and all were answered. The patient agreed with the plan and demonstrated an understanding of the instructions.   The patient was advised to call back or seek an in-person evaluation if the symptoms worsen or if the condition fails to improve as anticipated.

## 2019-05-17 NOTE — Assessment & Plan Note (Signed)
She reports er ADHD is well controlled on Adderall XR 10mg  each morning and Adderall 15mg  at 1400 each afternoon. PDMP review- she is filling these medications Q4-5 weeks She has been using Clonidine 0.1mg  PRN for additional focus control- recommend weaning off this medication.

## 2019-05-17 NOTE — Assessment & Plan Note (Signed)
Well controlled on Spironolactone 100mg  QD 09/2018 CMP K+ 4.2

## 2019-05-21 ENCOUNTER — Other Ambulatory Visit: Payer: Self-pay | Admitting: Adult Health

## 2019-06-04 ENCOUNTER — Telehealth: Payer: Self-pay

## 2019-06-04 NOTE — Telephone Encounter (Signed)
Mina Marble requested that I follow up with pt re: MyChart messages from 05/17/2019 that pt has not responded to.  Good Afternoon Ann Christensen, Can you please provide your home blood pressure readings and current weight?  Current weight is 155lb.  Pt will check BP and let us know.  Also how often do you use Clonidine 0.1mg ? On average she takes this twice weekly PRN sleep.    How long have you been on that medication?  She states that she has been taking this medication since she became a patient of this practice  If your focus/concentration is well controlled on the Adderall XR 10mg  in am and Adderall 15mg  at 2pm- I would recommend trying to wean off the Clonidine.  She does not take the Clonidine for this issue  Also you are past due for you PAP- can I put in a referral to a local OB/GYN for you?  Pt declined referral at this time d/t Coronavirus.    Also since you are on birth control, I would recommend coming off the Spirolactone.  Pt states that she takes this medication for cystic acne and does not want to give it up.  She states that this is the only medication that has ever helped with this issue.

## 2019-06-05 NOTE — Telephone Encounter (Signed)
Good Morning Tonya, Can you please call Ms. Dengel and ask the barrage of questions. Thanks! Valetta Fuller

## 2019-06-13 ENCOUNTER — Other Ambulatory Visit: Payer: Self-pay | Admitting: Adult Health

## 2019-06-13 ENCOUNTER — Encounter: Payer: Self-pay | Admitting: Adult Health

## 2019-06-13 DIAGNOSIS — Z0000 Encounter for general adult medical examination without abnormal findings: Secondary | ICD-10-CM

## 2019-06-13 NOTE — Progress Notes (Signed)
OB/GYN referral placed to LGBT sensitive provider

## 2019-06-17 ENCOUNTER — Encounter: Payer: Self-pay | Admitting: Adult Health

## 2019-06-18 ENCOUNTER — Other Ambulatory Visit: Payer: Self-pay

## 2019-06-18 MED ORDER — ALPRAZOLAM 0.25 MG PO TABS: ORAL_TABLET | ORAL | 0 refills | Status: DC

## 2019-06-18 MED ORDER — AMPHETAMINE-DEXTROAMPHETAMINE 15 MG PO TABS: ORAL_TABLET | ORAL | 0 refills | Status: DC

## 2019-06-18 MED ORDER — AMPHETAMINE-DEXTROAMPHET ER 10 MG PO CP24: 10.0000 mg | ORAL_CAPSULE | Freq: Every day | ORAL | 0 refills | Status: DC

## 2019-06-26 ENCOUNTER — Encounter: Payer: Self-pay | Admitting: Obstetrics and Gynecology

## 2019-07-06 ENCOUNTER — Other Ambulatory Visit: Payer: Self-pay | Admitting: Adult Health

## 2019-07-28 ENCOUNTER — Other Ambulatory Visit: Payer: Self-pay | Admitting: Adult Health

## 2019-07-29 ENCOUNTER — Encounter: Payer: Self-pay | Admitting: Adult Health

## 2019-07-30 ENCOUNTER — Other Ambulatory Visit: Payer: Self-pay

## 2019-07-30 MED ORDER — AMPHETAMINE-DEXTROAMPHET ER 10 MG PO CP24: 10.0000 mg | ORAL_CAPSULE | Freq: Every day | ORAL | 0 refills | Status: DC

## 2019-07-30 MED ORDER — ALPRAZOLAM 0.25 MG PO TABS: ORAL_TABLET | ORAL | 0 refills | Status: DC

## 2019-07-30 MED ORDER — AMPHETAMINE-DEXTROAMPHETAMINE 15 MG PO TABS: ORAL_TABLET | ORAL | 0 refills | Status: DC

## 2019-07-30 NOTE — Telephone Encounter (Signed)
Last OV note advised pt to wean off of clonidine.  Please review refill request and authorize if appropriate.  Tiajuana Amass, CMA

## 2019-07-30 NOTE — Telephone Encounter (Signed)
Non-Urgent Medical Question Received: Yesterday Message Contents  Ercia, Crisafulli [facility]  Phone Number: [phone]  Hi there,   Messaging to refill my 2 Adderall prescriptions and Xanax. I hope you all had a wonderful holiday season and a happy new year!   Thank you,  Rosamond       Non-Urgent Medical Question  Jenifer, Dawna M  Danford, Katy D, NP 22 hours ago (9:45 AM)  CB Hi there,  Messaging to refill my 2 Adderall prescriptions and Xanax. I hope you all had a wonderful holiday season and a happy new year!  Thank you, Dimples

## 2019-08-29 ENCOUNTER — Other Ambulatory Visit: Payer: Self-pay | Admitting: Adult Health

## 2019-08-30 ENCOUNTER — Other Ambulatory Visit: Payer: Self-pay | Admitting: Adult Health

## 2019-08-30 ENCOUNTER — Telehealth: Payer: Self-pay | Admitting: Adult Health

## 2019-08-30 MED ORDER — AMPHETAMINE-DEXTROAMPHETAMINE 15 MG PO TABS: ORAL_TABLET | ORAL | 0 refills | Status: DC

## 2019-08-30 MED ORDER — ALBUTEROL SULFATE HFA 108 (90 BASE) MCG/ACT IN AERS: INHALATION_SPRAY | RESPIRATORY_TRACT | 0 refills | Status: DC

## 2019-08-30 MED ORDER — AMPHETAMINE-DEXTROAMPHET ER 10 MG PO CP24: 10.0000 mg | ORAL_CAPSULE | Freq: Every day | ORAL | 0 refills | Status: DC

## 2019-08-30 NOTE — Telephone Encounter (Signed)
Good Morning Ann Christensen, I will refill Meds for Ann Christensen today, but she will need OV prior to any additional refills. Please have her schedule TeleMedicine appt with Dr. Sharee Holster in the next few weeks. Thanks! Orpha Bur

## 2019-08-30 NOTE — Progress Notes (Signed)
Pt needs OV prior to ANY additional rx refills  Last OV was end of Oct 2020 Instructed to f/u Q3M

## 2019-08-30 NOTE — Telephone Encounter (Signed)
Patient called states she takes  (2) different doses of Adderall :  The (2 pills) should total 35MG  which manufactures do not make tablet in this does (hince the 2 separate pills)      Disp Refills Start End   1)---amphetamine-dextroamphetamine (ADDERALL XR) 10 MG 24 hr capsule 30 capsule 0 08/30/2019    Sig - Route: Take 1 capsule (10 mg total) by mouth daily. - Oral   Sent to pharmacy as: amphetamine-dextroamphetamine (ADDERALL XR) 10 MG 24 hr capsule   Earliest Fill Date: 08/30/2019   E-Prescribing Status: Receipt confirmed by pharmacy (08/30/2019 8:15 AM EST)     2)----amphetamine-dextroamphetamine (ADDERALL) 15 MG tablet 10/28/2019 DISCONTINUED  Order Details Dose: 15 mg Route: Oral Frequency: Daily  Dispense Quantity: 90 tablet Refills: 0       Sig: Take 1 tablet by mouth daily. Take at 2pm.   --- Per patient the 2nd request has been denied by provider (Unsure why).   ----Forwarding message to med asst & Provider for review & if approved send Refill order to :   Preferred Pharmacies      [id number] - [place], [place] - [address] [phone] (Phone) [phone] (Fax   ---glh

## 2019-09-23 ENCOUNTER — Other Ambulatory Visit: Payer: Self-pay | Admitting: Adult Health

## 2019-09-23 ENCOUNTER — Encounter: Payer: Self-pay | Admitting: Adult Health

## 2019-10-06 ENCOUNTER — Encounter: Payer: Self-pay | Admitting: Adult Health

## 2019-10-23 DIAGNOSIS — R69 Illness, unspecified: Secondary | ICD-10-CM | POA: Diagnosis not present

## 2020-01-03 DIAGNOSIS — K580 Irritable bowel syndrome with diarrhea: Secondary | ICD-10-CM | POA: Diagnosis not present

## 2020-01-03 DIAGNOSIS — J45909 Unspecified asthma, uncomplicated: Secondary | ICD-10-CM | POA: Diagnosis not present

## 2020-01-03 DIAGNOSIS — E282 Polycystic ovarian syndrome: Secondary | ICD-10-CM | POA: Diagnosis not present

## 2020-01-03 DIAGNOSIS — K2100 Gastro-esophageal reflux disease with esophagitis, without bleeding: Secondary | ICD-10-CM | POA: Diagnosis not present

## 2020-01-03 DIAGNOSIS — Z09 Encounter for follow-up examination after completed treatment for conditions other than malignant neoplasm: Secondary | ICD-10-CM | POA: Diagnosis not present

## 2020-01-03 DIAGNOSIS — M62830 Muscle spasm of back: Secondary | ICD-10-CM | POA: Diagnosis not present

## 2020-01-03 DIAGNOSIS — R69 Illness, unspecified: Secondary | ICD-10-CM | POA: Diagnosis not present

## 2020-01-31 ENCOUNTER — Other Ambulatory Visit (HOSPITAL_COMMUNITY)
Admission: RE | Admit: 2020-01-31 | Discharge: 2020-01-31 | Disposition: A | Discharge status: Home / Self Care | Payer: 59 | Source: Ambulatory Visit | Attending: Obstetrics and Gynecology | Admitting: Obstetrics and Gynecology

## 2020-01-31 ENCOUNTER — Other Ambulatory Visit: Payer: Self-pay

## 2020-01-31 ENCOUNTER — Encounter: Payer: Self-pay | Admitting: Obstetrics and Gynecology

## 2020-01-31 ENCOUNTER — Ambulatory Visit: Payer: 59 | Admitting: Obstetrics and Gynecology

## 2020-01-31 VITALS — BP 122/68 | HR 68 | Ht 64.0 in | Wt 151.0 lb

## 2020-01-31 DIAGNOSIS — Z124 Encounter for screening for malignant neoplasm of cervix: Secondary | ICD-10-CM | POA: Diagnosis not present

## 2020-01-31 DIAGNOSIS — E559 Vitamin D deficiency, unspecified: Secondary | ICD-10-CM

## 2020-01-31 DIAGNOSIS — Z23 Encounter for immunization: Secondary | ICD-10-CM

## 2020-01-31 DIAGNOSIS — Z7189 Other specified counseling: Secondary | ICD-10-CM | POA: Diagnosis not present

## 2020-01-31 DIAGNOSIS — Z01419 Encounter for gynecological examination (general) (routine) without abnormal findings: Secondary | ICD-10-CM

## 2020-01-31 DIAGNOSIS — Z0000 Encounter for general adult medical examination without abnormal findings: Secondary | ICD-10-CM

## 2020-01-31 DIAGNOSIS — Z7185 Encounter for immunization safety counseling: Secondary | ICD-10-CM

## 2020-01-31 MED ORDER — NORETHIN ACE-ETH ESTRAD-FE 1-20 MG-MCG(24) PO CHEW: CHEWABLE_TABLET | ORAL | 3 refills | Status: DC

## 2020-01-31 NOTE — Patient Instructions (Signed)
EXERCISE AND DIET:  We recommended that you start or continue a regular exercise program for good health. Regular exercise means any activity that makes your heart beat faster and makes you sweat.  We recommend exercising at least 30 minutes per day at least 3 days a week, preferably 4 or 5.  We also recommend a diet low in fat and sugar.  Inactivity, poor dietary choices and obesity can cause diabetes, heart attack, stroke, and kidney damage, among others.    ALCOHOL AND SMOKING:  Women should limit their alcohol intake to no more than 7 drinks/beers/glasses of wine (combined, not each!) per week. Moderation of alcohol intake to this level decreases your risk of breast cancer and liver damage. And of course, no recreational drugs are part of a healthy lifestyle.  And absolutely no smoking or even second hand smoke. Most people know smoking can cause heart and lung diseases, but did you know it also contributes to weakening of your bones? Aging of your skin?  Yellowing of your teeth and nails?  CALCIUM AND VITAMIN D:  Adequate intake of calcium and Vitamin D are recommended.  The recommendations for exact amounts of these supplements seem to change often, but generally speaking 1,000 mg of calcium (between diet and supplement) and 800 units of Vitamin D per day seems prudent. Certain women may benefit from higher intake of Vitamin D.  If you are among these women, your doctor will have told you during your visit.    PAP SMEARS:  Pap smears, to check for cervical cancer or precancers,  have traditionally been done yearly, although recent scientific advances have shown that most women can have pap smears less often.  However, every woman still should have a physical exam from her gynecologist every year. It will include a breast check, inspection of the vulva and vagina to check for abnormal growths or skin changes, a visual exam of the cervix, and then an exam to evaluate the size and shape of the uterus and  ovaries.  And after 35 years of age, a rectal exam is indicated to check for rectal cancers. We will also provide age appropriate advice regarding health maintenance, like when you should have certain vaccines, screening for sexually transmitted diseases, bone density testing, colonoscopy, mammograms, etc.   MAMMOGRAMS:  All women over 40 years old should have a yearly mammogram. Many facilities now offer a "3D" mammogram, which may cost around $50 extra out of pocket. If possible,  we recommend you accept the option to have the 3D mammogram performed.  It both reduces the number of women who will be called back for extra views which then turn out to be normal, and it is better than the routine mammogram at detecting truly abnormal areas.    COLON CANCER SCREENING: Now recommend starting at age 45. At this time colonoscopy is not covered for routine screening until 50. There are take home tests that can be done between 45-49.   COLONOSCOPY:  Colonoscopy to screen for colon cancer is recommended for all women at age 50.  We know, you hate the idea of the prep.  We agree, BUT, having colon cancer and not knowing it is worse!!  Colon cancer so often starts as a polyp that can be seen and removed at colonscopy, which can quite literally save your life!  And if your first colonoscopy is normal and you have no family history of colon cancer, most women don't have to have it again for   10 years.  Once every ten years, you can do something that may end up saving your life, right?  We will be happy to help you get it scheduled when you are ready.  Be sure to check your insurance coverage so you understand how much it will cost.  It may be covered as a preventative service at no cost, but you should check your particular policy.      Breast Self-Awareness Breast self-awareness means being familiar with how your breasts look and feel. It involves checking your breasts regularly and reporting any changes to your  health care provider. Practicing breast self-awareness is important. A change in your breasts can be a sign of a serious medical problem. Being familiar with how your breasts look and feel allows you to find any problems early, when treatment is more likely to be successful. All women should practice breast self-awareness, including women who have had breast implants. How to do a breast self-exam One way to learn what is normal for your breasts and whether your breasts are changing is to do a breast self-exam. To do a breast self-exam: Look for Changes  1. Remove all the clothing above your waist. 2. Stand in front of a mirror in a room with good lighting. 3. Put your hands on your hips. 4. Push your hands firmly downward. 5. Compare your breasts in the mirror. Look for differences between them (asymmetry), such as: ? Differences in shape. ? Differences in size. ? Puckers, dips, and bumps in one breast and not the other. 6. Look at each breast for changes in your skin, such as: ? Redness. ? Scaly areas. 7. Look for changes in your nipples, such as: ? Discharge. ? Bleeding. ? Dimpling. ? Redness. ? A change in position. Feel for Changes Carefully feel your breasts for lumps and changes. It is best to do this while lying on your back on the floor and again while sitting or standing in the shower or tub with soapy water on your skin. Feel each breast in the following way:  Place the arm on the side of the breast you are examining above your head.  Feel your breast with the other hand.  Start in the nipple area and make  inch (2 cm) overlapping circles to feel your breast. Use the pads of your three middle fingers to do this. Apply light pressure, then medium pressure, then firm pressure. The light pressure will allow you to feel the tissue closest to the skin. The medium pressure will allow you to feel the tissue that is a little deeper. The firm pressure will allow you to feel the tissue  close to the ribs.  Continue the overlapping circles, moving downward over the breast until you feel your ribs below your breast.  Move one finger-width toward the center of the body. Continue to use the  inch (2 cm) overlapping circles to feel your breast as you move slowly up toward your collarbone.  Continue the up and down exam using all three pressures until you reach your armpit.  Write Down What You Find  Write down what is normal for each breast and any changes that you find. Keep a written record with breast changes or normal findings for each breast. By writing this information down, you do not need to depend only on memory for size, tenderness, or location. Write down where you are in your menstrual cycle, if you are still menstruating. If you are having trouble noticing differences   in your breasts, do not get discouraged. With time you will become more familiar with the variations in your breasts and more comfortable with the exam. How often should I examine my breasts? Examine your breasts every month. If you are breastfeeding, the best time to examine your breasts is after a feeding or after using a breast pump. If you menstruate, the best time to examine your breasts is 5-7 days after your period is over. During your period, your breasts are lumpier, and it may be more difficult to notice changes. When should I see my health care provider? See your health care provider if you notice:  A change in shape or size of your breasts or nipples.  A change in the skin of your breast or nipples, such as a reddened or scaly area.  Unusual discharge from your nipples.  A lump or thick area that was not there before.  Pain in your breasts.  Anything that concerns you.  

## 2020-01-31 NOTE — Progress Notes (Signed)
34 y.o. G0P0000 Married White or Caucasian Not Hispanic or Latino female here for annual exam.  She has a h/o PCOS. She is on continuous OCP's without a cycle.  Sexually active with her wife, married x 3 years.     No LMP recorded. (Menstrual status: Oral contraceptives).          Sexually active: Yes.    Female partner  The current method of family planning is OCP (estrogen/progesterone).    Exercising: Yes.    Train horses  Smoker:  no  Health Maintenance: Pap: 2016 neg  History of abnormal Pap:  no MMG:  NA  BMD:   None  Colonoscopy: none TDaP:  04/05/2013 Gardasil: none    reports that she has never smoked. She has never used smokeless tobacco. She reports current alcohol use. She reports current drug use. Drug: Marijuana. 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   Past Medical History:  Diagnosis Date  . Anxiety   . Depression   . Hormone disorder   . PCOS (polycystic ovarian syndrome)     Past Surgical History:  Procedure Laterality Date  . TONSILLECTOMY AND ADENOIDECTOMY     She was 34 years old  . WISDOM TOOTH EXTRACTION  2011    Current Outpatient Medications  Medication Sig Dispense Refill  . albuterol (VENTOLIN HFA) 108 (90 Base) MCG/ACT inhaler INHALE 2 PUFFS BY MOUTH EVERY 4 TO 6 HOURS AS NEEDED FOR WHEEZE OR SHORTNESS OF BREATH 18 g 0  . ALPRAZolam (XANAX) 0.25 MG tablet TAKE ONE (1) TABLET BY MOUTH TWO (2) TIMES DAILY AS NEEDED FOR ANXIETY 20 tablet 0  . buPROPion (WELLBUTRIN XL) 300 MG 24 hr tablet Take 1 tablet (300 mg total) by mouth daily. 90 tablet 3  . cyclobenzaprine (FLEXERIL) 10 MG tablet TAKE 1 TABLET BY MOUTH 3  TIMES DAILY AS NEEDED FOR  MUSCLE SPASM(S) 90 tablet 1  . montelukast (SINGULAIR) 10 MG tablet Take 10 mg by mouth daily.    . Multiple Vitamin (MULTIVITAMIN) tablet Take 1 tablet by mouth daily.    [name] MYDAYIS 37.5 MG CP24 Take 1 capsule by mouth every morning.    . Norethin  Ace-Eth Estrad-FE (MIBELAS 24 FE) 1-20 MG-MCG(24) CHEW CHEW AND SWALLOW 1 TABLET   DAILY 84 tablet 3  . spironolactone (ALDACTONE) 100 MG tablet TAKE 1 TABLET BY MOUTH EVERY DAY 90 tablet 1   No current facility-administered medications for this visit.    Family History  Problem Relation Age of Onset  . Hypertension Mother   . Breast cancer Mother 76       enviromental   . Alcohol abuse Father   . Hypertension Father   . Thyroid disease Maternal Aunt   . Cancer Maternal Grandmother        colon  . Diabetes Paternal Grandmother   Mom was 48 at diagnosis of breast cancer. Not genetic.   Review of Systems  All other systems reviewed and are negative.   Exam:   BP 122/68   Pulse 68   Ht 5\' 4"  (1.626 m)   Wt 151 lb (68.5 kg)   SpO2 99%   BMI 25.92 kg/m   Weight change: @WEIGHTCHANGE @ Height:   Height: 5\' 4"  (162.6 cm)  Ht Readings from Last 3 Encounters:  01/31/20 5\' 4"  (1.626 m)  07/05/18 5\' 5"  (1.651 m)  01/03/18 5\' 5"  (1.651 m)  General appearance: alert, cooperative and appears stated age Head: Normocephalic, without obvious abnormality, atraumatic Neck: no adenopathy, supple, symmetrical, trachea midline and thyroid normal to inspection and palpation Lungs: clear to auscultation bilaterally Cardiovascular: regular rate and rhythm Breasts: normal appearance, no masses or tenderness Abdomen: soft, non-tender; non distended,  no masses,  no organomegaly Extremities: extremities normal, atraumatic, no cyanosis or edema Skin: Skin color, texture, turgor normal. No rashes or lesions Lymph nodes: Cervical, supraclavicular, and axillary nodes normal. No abnormal inguinal nodes palpated Neurologic: Grossly normal   Pelvic: External genitalia:  no lesions              Urethra:  normal appearing urethra with no masses, tenderness or lesions              Bartholins and Skenes: normal                 Vagina: normal appearing vagina with normal color and discharge, no  lesions              Cervix: no lesions               Bimanual Exam:  Uterus:  normal size, contour, position, consistency, mobility, non-tender and anteverted              Adnexa: no mass, fullness, tenderness               Rectovaginal: Confirms               Anus:  normal sphincter tone, no lesions  Carolynn Serve chaperoned for the exam.  A:  Well Woman with normal exam  On continuous OCP's, doing well  Vit d def  P:   Pap with hpv  Start gardasil series (will get one a year)  Continue OCP's  Screening labs vit d  Discussed breast self exam  Discussed calcium and vit D intake

## 2020-02-01 LAB — COMPREHENSIVE METABOLIC PANEL
ALT: 19 IU/L (ref 0–32)
AST: 22 IU/L (ref 0–40)
Albumin/Globulin Ratio: 1.9 (ref 1.2–2.2)
Albumin: 4.4 g/dL (ref 3.8–4.8)
Alkaline Phosphatase: 53 IU/L (ref 48–121)
BUN/Creatinine Ratio: 13 (ref 9–23)
BUN: 11 mg/dL (ref 6–20)
Bilirubin Total: 0.5 mg/dL (ref 0.0–1.2)
CO2: 25 mmol/L (ref 20–29)
Calcium: 9.4 mg/dL (ref 8.7–10.2)
Chloride: 103 mmol/L (ref 96–106)
Creatinine, Ser: 0.86 mg/dL (ref 0.57–1.00)
GFR calc Af Amer: 102 mL/min/{1.73_m2} (ref >59)
GFR calc non Af Amer: 88 mL/min/{1.73_m2} (ref >59)
Globulin, Total: 2.3 g/dL (ref 1.5–4.5)
Glucose: 90 mg/dL (ref 65–99)
Potassium: 4.2 mmol/L (ref 3.5–5.2)
Sodium: 139 mmol/L (ref 134–144)
Total Protein: 6.7 g/dL (ref 6.0–8.5)

## 2020-02-01 LAB — CYTOLOGY - PAP
Comment: NEGATIVE
Diagnosis: NEGATIVE
High risk HPV: NEGATIVE

## 2020-02-01 LAB — VITAMIN D 25 HYDROXY (VIT D DEFICIENCY, FRACTURES): Vit D, 25-Hydroxy: 59.5 ng/mL (ref 30.0–100.0)

## 2020-02-01 LAB — LIPID PANEL
Chol/HDL Ratio: 2.5 ratio (ref 0.0–4.4)
Cholesterol, Total: 199 mg/dL (ref 100–199)
HDL: 80 mg/dL (ref >39)
LDL Chol Calc (NIH): 107 mg/dL — ABNORMAL HIGH (ref 0–99)
Triglycerides: 65 mg/dL (ref 0–149)
VLDL Cholesterol Cal: 12 mg/dL (ref 5–40)

## 2020-02-01 LAB — CBC
Hematocrit: 37.8 % (ref 34.0–46.6)
Hemoglobin: 12.7 g/dL (ref 11.1–15.9)
MCH: 32.7 pg (ref 26.6–33.0)
MCHC: 33.6 g/dL (ref 31.5–35.7)
MCV: 97 fL (ref 79–97)
Platelets: 285 10*3/uL (ref 150–450)
RBC: 3.88 x10E6/uL (ref 3.77–5.28)
RDW: 11.8 % (ref 11.7–15.4)
WBC: 5.8 10*3/uL (ref 3.4–10.8)

## 2020-03-31 DIAGNOSIS — R69 Illness, unspecified: Secondary | ICD-10-CM | POA: Diagnosis not present

## 2020-03-31 DIAGNOSIS — J45909 Unspecified asthma, uncomplicated: Secondary | ICD-10-CM | POA: Diagnosis not present

## 2020-03-31 DIAGNOSIS — F419 Anxiety disorder, unspecified: Secondary | ICD-10-CM | POA: Diagnosis not present

## 2020-03-31 DIAGNOSIS — K2100 Gastro-esophageal reflux disease with esophagitis, without bleeding: Secondary | ICD-10-CM | POA: Diagnosis not present

## 2020-03-31 DIAGNOSIS — E282 Polycystic ovarian syndrome: Secondary | ICD-10-CM | POA: Diagnosis not present

## 2020-03-31 DIAGNOSIS — K580 Irritable bowel syndrome with diarrhea: Secondary | ICD-10-CM | POA: Diagnosis not present

## 2020-03-31 DIAGNOSIS — G8929 Other chronic pain: Secondary | ICD-10-CM | POA: Diagnosis not present

## 2020-03-31 DIAGNOSIS — F4310 Post-traumatic stress disorder, unspecified: Secondary | ICD-10-CM | POA: Diagnosis not present

## 2020-03-31 DIAGNOSIS — M62830 Muscle spasm of back: Secondary | ICD-10-CM | POA: Diagnosis not present

## 2023-06-14 ENCOUNTER — Other Ambulatory Visit: Payer: Self-pay | Admitting: Internal Medicine

## 2023-06-14 DIAGNOSIS — R5383 Other fatigue: Secondary | ICD-10-CM

## 2023-06-14 DIAGNOSIS — R55 Syncope and collapse: Secondary | ICD-10-CM

## 2023-06-14 DIAGNOSIS — R42 Dizziness and giddiness: Secondary | ICD-10-CM

## 2023-06-23 ENCOUNTER — Encounter (HOSPITAL_COMMUNITY): Payer: Self-pay | Admitting: Internal Medicine

## 2023-06-23 ENCOUNTER — Other Ambulatory Visit: Payer: Self-pay | Admitting: Internal Medicine

## 2023-06-23 DIAGNOSIS — R109 Unspecified abdominal pain: Secondary | ICD-10-CM

## 2023-06-23 DIAGNOSIS — N182 Chronic kidney disease, stage 2 (mild): Secondary | ICD-10-CM

## 2023-07-07 ENCOUNTER — Other Ambulatory Visit: Payer: 59

## 2023-07-26 ENCOUNTER — Ambulatory Visit: Payer: No Typology Code available for payment source | Admitting: Allergy

## 2023-07-26 ENCOUNTER — Encounter: Payer: Self-pay | Admitting: Allergy

## 2023-07-26 ENCOUNTER — Other Ambulatory Visit: Payer: Self-pay | Admitting: Allergy

## 2023-07-26 VITALS — BP 120/78 | HR 80 | Temp 98.7°F | Resp 20 | Ht 63.5 in | Wt 178.0 lb

## 2023-07-26 DIAGNOSIS — H1010 Acute atopic conjunctivitis, unspecified eye: Secondary | ICD-10-CM

## 2023-07-26 DIAGNOSIS — J4520 Mild intermittent asthma, uncomplicated: Secondary | ICD-10-CM | POA: Diagnosis not present

## 2023-07-26 DIAGNOSIS — K219 Gastro-esophageal reflux disease without esophagitis: Secondary | ICD-10-CM

## 2023-07-26 DIAGNOSIS — H1013 Acute atopic conjunctivitis, bilateral: Secondary | ICD-10-CM

## 2023-07-26 DIAGNOSIS — J309 Allergic rhinitis, unspecified: Secondary | ICD-10-CM | POA: Diagnosis not present

## 2023-07-26 DIAGNOSIS — W57XXXA Bitten or stung by nonvenomous insect and other nonvenomous arthropods, initial encounter: Secondary | ICD-10-CM

## 2023-07-26 DIAGNOSIS — R21 Rash and other nonspecific skin eruption: Secondary | ICD-10-CM

## 2023-07-26 MED ORDER — ALBUTEROL SULFATE HFA 108 (90 BASE) MCG/ACT IN AERS: INHALATION_SPRAY | RESPIRATORY_TRACT | 1 refills | Status: DC

## 2023-07-26 NOTE — Progress Notes (Addendum)
 New Patient Note  RE: Ann Christensen MRN: [id number] DOB: Apr 26, 1986 Date of Office Visit: 07/26/2023  Primary care provider: Cleotilde, [place]  E, PA  Chief Complaint: rash  History of present illness: Ann Christensen is a 38 y.o. female presenting today for evaluation of allergies.  Discussed the use of AI scribe software for clinical note transcription with the patient, who gave verbal consent to proceed.  The patient, with a history of allergies, presents with a persistent rash of unknown etiology. The rash first appeared in July, following a sting on the leg, and has since spread to multiple areas of the body, including the stomach, foot, hips, chest, and ear. The patient describes the rash as raised, firm, and bumpy, with no underlying lumps. The rash has been persistent despite treatment with antivirals since October, and new lesions continue to sporadically appear.  She states she has tried all types of topical therapies for this rash and nothing is helping including topical steroids and antifungals.  She does recall taking a prednisone course and this did help however she did not have side effects related to prednisone use.  The patient has a history of shingles at the age of 38, triggered by stress at the time. She recalls having chickenpox at a younger age. Her current rash is thought to be shingles by her PCP and she is on valacyclovir since October as well as gabapentin.  She does feel like the rash is somewhat improved since she has been on the antiviral but she still has a lot of scarring and new lesions appearing.  The patient also reports experiencing nerve pain and lethargy, which have significantly impacted her daily activities and job performance. The nerve pain is managed with gabapentin, but the patient reports that it becomes sporadic and severe when the medication runs out.  In addition to the rash and nerve pain, the patient has been experiencing heart  problems, including ectopic heartbeats and heart palpitations. These symptoms led to a two-week heart monitor study.  She will be going to see a cardiologist for this issue.    The patient also reports a history of gastroesophageal reflux disease (GERD), which was previously treated and resolved since she does recall taking famotidine which helped.  The patient has been managing her allergies with over-the-counter antihistamines and recently started again taking famotidine, which has helped considerably with her regular allergies.  She states she is allergic to everything.  She was on immunotherapy from 71 to 61 years old.  She states every stinging insect seems to cause some type of reaction.  She has history of asthma as well and has an albuterol  inhaler for rescue purposes which she rarely has to use however she does report having a recent URI over the holidays where she did use it several times.  She recalls in the past using Advair discus.  The patient [place] and works [place] farm, which exposes her to a higher risk of insect bites and stings. She reports a history of blistering and scarring reactions to insect stings.   She was able to provide labs she has had done recently by PCP through her portal showing the following: Negative ANA, normal CMP, positive IgG antibodies to EBV, reactive IgG to varicella-zoster, normal CRP, negative RF, normal ESR, normal TSH at 1.84, CBC with eosinophil count of 300 without normal RBC, MCV/MDW.   Review of systems: 10pt ROS negative unless noted above in HPI  All other systems negative unless noted  above in HPI  Past medical history: Past Medical History:  Diagnosis Date   Acne vulgaris 05/17/2019   ADHD (attention deficit hyperactivity disorder) 09/09/2016   Anxiety    Anxiety disorder    Asthma    Chronic pain syndrome    Controlled substance agreement signed 01/03/2018   Depression    Depression, recurrent (HCC) 09/09/2016   Diaphragmatic hernia  09/28/2007   Qualifier: Diagnosis of   By: Vernell LPN, Barnie SCULL SNOMED Dx Update Oct 2024     Dizziness    Environmental allergies    GERD 09/28/2007   Qualifier: Diagnosis of   By: Vernell LPN, Deborah         Health care maintenance 12/08/2016   Hormone disorder    Insomnia 09/09/2016   IRRITABLE BOWEL SYNDROME 09/28/2007   Qualifier: Diagnosis of   By: Vernell LPN, Deborah         Multiple food allergies 07/05/2018   Other fatigue    PAC (premature atrial contraction)    Palpitations    Panic disorder    PCOS (polycystic ovarian syndrome)    Pharyngoesophageal dysphagia    PTSD (post-traumatic stress disorder)    Scoliosis    Skin rash     Past surgical history: Past Surgical History:  Procedure Laterality Date   TONSILLECTOMY AND ADENOIDECTOMY     She was 39 years old   WISDOM TOOTH EXTRACTION  2011    Family history:  Family History  Problem Relation Age of Onset   Hypertension Mother    Breast cancer Mother 85       enviromental    Alcohol abuse Father    Hypertension Father    Sinusitis Sister    Thyroid disease Maternal Aunt    Cancer Maternal Grandmother        colon   Diabetes Paternal Grandmother    Asthma Neg Hx     Social history: Lives in a home without carpeting with electric heating and central cooling.  For adults in the home.  There is no concern for roaches in the home.  The home is old and most likely has issues with water damage or mildew.  She is a insurance account manager.  She denies a smoking history.  Medication List: Current Outpatient Medications  Medication Sig Dispense Refill   buPROPion  (WELLBUTRIN  XL) 150 MG 24 hr tablet Take 150 mg by mouth daily.     gabapentin (NEURONTIN) 100 MG capsule Take 100 mg by mouth 3 (three) times daily.     predniSONE (STERAPRED UNI-PAK 21 TAB) 10 MG (21) TBPK tablet Take by mouth as directed.     valACYclovir (VALTREX) 1000 MG tablet Take 1,000 mg by mouth daily.     albuterol  (VENTOLIN  HFA)  108 (90 Base) MCG/ACT inhaler INHALE 2 PUFFS BY MOUTH EVERY 4 TO 6 HOURS AS NEEDED FOR WHEEZE OR SHORTNESS OF BREATH 18 g 1   Norethin  Ace-Eth Estrad-FE (MIBELAS 24 FE) 1-20 MG-MCG(24) CHEW Chew 1 each by mouth daily.     No current facility-administered medications for this visit.    Known medication allergies: Allergies  Allergen Reactions   Bee Venom    Latex Rash    Adhesives    Physical examination: Blood pressure 120/78, pulse 80, temperature 98.7 F (37.1 C), temperature source Temporal, resp. rate 20, height 5' 3.5 (1.613 m), weight 178 lb (80.7 kg), SpO2 98%.  General: Alert, interactive, in no acute distress. HEENT: PERRLA, TMs pearly  gray, turbinates minimally edematous without discharge, post-pharynx non erythematous. Neck: Supple without lymphadenopathy. Lungs: Clear to auscultation without wheezing, rhonchi or rales. {no increased work of breathing. CV: Normal S1, S2 without murmurs. Abdomen: Nondistended, nontender. Skin: Several hyperpigmented.  Macules to patches on the lower leg, forearm/wrist area.  Several excoriated lesions on erythematous base on the abdomen below the umbilicus, scaling of the right pinna, . Extremities:  No clubbing, cyanosis or edema. Neuro:   Grossly intact.  Diagnositics/Labs: Labs: See HPI Spirometry: FEV1 3.04 L 101%, FVC 4.16 L or 114% predicted.  Nonobstructive pattern  Assessment and plan:   Addendum to include plan previously left out of note - 08/03/22   Recurrent Rash Patient has a history of recurrent rash, previously treated with antifungals, steroids, and wound care. The rash has been present since July and has been resistant to treatment. The rash is located on various parts of the body including the hips, chest, and behind the ear. The patient has been on Valacyclovir and Gabapentin, but the cause of the rash is still unclear. -Refer to dermatology for further evaluation and possible biopsy. -Order labs to assess immune  system function. -Consider alternative treatments such as Dupixent  if the rash is determined to be a long-term issue and consistent biopsy with such treatment.  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 The patient has been managing symptoms with over-the-counter antihistamines. -Order a stinging insect panel to assess for IgE as she may benefit from venom immunotherapy to reduce allergic response. -Order environmental allergy  panel -Recommend use of cetirizine over loratadine or cetirizine 10 mg daily as needed tends to be more effective  Asthma Patient has a history of asthma and has been on Montelukast, but stopped due to side effect profile. The patient has been using an Albuterol  inhaler as needed. -Consider alternative asthma control medications if needed to control is not good. -Have access to albuterol  inhaler 2 puffs every 4-6 hours as needed for cough/wheeze/shortness of breath/chest tightness.  May use 15-20 minutes prior to activity.   Monitor frequency of use.   -Provide refills for Albuterol  inhaler.  GERD Patient has a history of severe GERD and has been taking Famotidine. -Continue Famotidine as needed.  Follow-up in approximately two months to reassess conditions and treatment plans.   I appreciate the opportunity to take part in Ann Christensen's care. Please do not hesitate to contact me with questions.  Sincerely,   Ann Brain, MD Allergy /Immunology [facility]

## 2023-07-26 NOTE — Addendum Note (Signed)
 Addended by: Maryjean Morn D on: 07/26/2023 02:02 PM   Modules accepted: Orders

## 2023-07-26 NOTE — Patient Instructions (Addendum)
 Recurrent Rash Patient has a history of recurrent rash, previously treated with antifungals, steroids, and wound care. The rash has been present since July and has been resistant to treatment. The rash is located on various parts of the body including the hips, chest, and behind the ear. The patient has been on Valacyclovir and Gabapentin, but the cause of the rash is still unclear. -Refer to dermatology for further evaluation and possible biopsy. -Order labs to assess immune system function. -Consider alternative treatments such as Dupixent  if the rash is determined to be a long-term issue and consistent biopsy with such treatment.  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 The patient has been managing symptoms with over-the-counter antihistamines. -Order a stinging insect panel to assess for IgE as she may benefit from venom immunotherapy to reduce allergic response. -Order environmental allergy  panel -Recommend use of cetirizine over loratadine or cetirizine 10 mg daily as needed tends to be more effective  Asthma Patient has a history of asthma and has been on Montelukast, but stopped due to side effect profile. The patient has been using an Albuterol  inhaler as needed. -Consider alternative asthma control medications if needed to control is not good. -Have access to albuterol  inhaler 2 puffs every 4-6 hours as needed for cough/wheeze/shortness of breath/chest tightness.  May use 15-20 minutes prior to activity.   Monitor frequency of use.   -Provide refills for Albuterol  inhaler.  GERD Patient has a history of severe GERD and has been taking Famotidine. -Continue Famotidine as needed.  Follow-up in approximately two months to reassess conditions and treatment plans.

## 2023-07-29 LAB — HYMENOPTERA VENOM ALLERGY II

## 2023-07-31 LAB — HYMENOPTERA VENOM ALLERGY II

## 2023-08-02 ENCOUNTER — Ambulatory Visit: Payer: 59 | Admitting: Allergy

## 2023-08-02 LAB — ALLERGENS W/TOTAL IGE AREA 2
Alternaria Alternata IgE: 33.3 kU/L — AB
Aspergillus Fumigatus IgE: 1.38 kU/L — AB
Bermuda Grass IgE: 1.41 kU/L — AB
Cat Dander IgE: 0.43 kU/L — AB
Cedar, Mountain IgE: 1.74 kU/L — AB
Cladosporium Herbarum IgE: 3.72 kU/L — AB
Cockroach, German IgE: 0.21 kU/L — AB
Common Silver Birch IgE: 1.25 kU/L — AB
Cottonwood IgE: 0.78 kU/L — AB
D Farinae IgE: 8.82 kU/L — AB
D Pteronyssinus IgE: 16.9 kU/L — AB
Dog Dander IgE: 0.4 kU/L — AB
Elm, American IgE: 1.66 kU/L — AB
IgE (Immunoglobulin E), Serum: 596 [IU]/mL — ABNORMAL HIGH (ref 6–495)
Johnson Grass IgE: 1.63 kU/L — AB
Maple/Box Elder IgE: 0.58 kU/L — AB
Mouse Urine IgE: 1.53 kU/L — AB
Oak, White IgE: 0.58 kU/L — AB
Pecan, Hickory IgE: 1.38 kU/L — AB
Penicillium Chrysogen IgE: 1.32 kU/L — AB
Pigweed, Rough IgE: 1.01 kU/L — AB
Ragweed, Short IgE: 1.49 kU/L — AB
Sheep Sorrel IgE Qn: 2.4 kU/L — AB
Timothy Grass IgE: 2.67 kU/L — AB
White Mulberry IgE: <0.1 kU/L

## 2023-08-02 LAB — STREP PNEUMONIAE 23 SEROTYPES IGG
Pneumo Ab Type 1*: 1.8 ug/mL (ref >1.3)
Pneumo Ab Type 14*: 1.6 ug/mL (ref >1.3)
Pneumo Ab Type 17 (17F)*: 0.6 ug/mL — ABNORMAL LOW (ref >1.3)
Pneumo Ab Type 19 (19F)*: 1.8 ug/mL (ref >1.3)
Pneumo Ab Type 2*: 1.2 ug/mL — ABNORMAL LOW (ref >1.3)
Pneumo Ab Type 20*: 1.6 ug/mL (ref >1.3)
Pneumo Ab Type 23 (23F)*: 0.7 ug/mL — ABNORMAL LOW (ref >1.3)
Pneumo Ab Type 43 (11A)*: 0.1 ug/mL — ABNORMAL LOW (ref >1.3)
Pneumo Ab Type 5*: 0.3 ug/mL — ABNORMAL LOW (ref >1.3)
Pneumo Ab Type 51 (7F)*: 0.3 ug/mL — ABNORMAL LOW (ref >1.3)
Pneumo Ab Type 54 (15B)*: 1.1 ug/mL — ABNORMAL LOW (ref >1.3)
Pneumo Ab Type 57 (19A)*: 1 ug/mL — ABNORMAL LOW (ref >1.3)
Pneumo Ab Type 68 (9V)*: 2.8 ug/mL (ref >1.3)
Pneumo Ab Type 70 (33F)*: 0.2 ug/mL — ABNORMAL LOW (ref >1.3)
Pneumo Ab Type 8*: 3 ug/mL (ref >1.3)
Pneumo Ab Type 9 (9N)*: 4.1 ug/mL (ref >1.3)

## 2023-08-02 LAB — HYMENOPTERA VENOM ALLERGY II
Bumblebee: 0.16 kU/L — AB
Hornet, White Face, IgE: 0.69 kU/L — AB
Hornet, Yellow, IgE: 0.41 kU/L — AB
I001-IgE Honeybee: 0.31 kU/L — AB
I209-IgE Ves v 5: 3.59 kU/L — AB
I211-IgE Ves v 1: 0.74 kU/L — AB
I211-IgE Ves v 1: 2.35 kU/L — AB
I215-IgE Api m 3: 0.38 kU/L — AB
Reflex Information: 1.63 kU/L — AB
Reflex Information: 2.47 kU/L — AB
Tryptase: 4.4 ug/L (ref 2.2–13.2)

## 2023-08-02 LAB — DIPHTHERIA / TETANUS ANTIBODY PANEL
Diphtheria Ab: 0.27 [IU]/mL (ref <0.10)
Tetanus Ab, IgG: 0.7 [IU]/mL (ref <0.10)

## 2023-08-02 LAB — ALLERGEN COMPONENT COMMENTS

## 2023-08-02 LAB — IGG, IGA, IGM
IgA/Immunoglobulin A, Serum: 143 mg/dL (ref 87–352)
IgG (Immunoglobin G), Serum: 1302 mg/dL (ref 586–1602)
IgM (Immunoglobulin M), Srm: 241 mg/dL — ABNORMAL HIGH (ref 26–217)

## 2023-08-02 LAB — ALLERGEN FIRE ANT: I070-IgE Fire Ant (Invicta): 2.24 kU/L — AB

## 2023-08-04 ENCOUNTER — Other Ambulatory Visit: Payer: Self-pay

## 2023-08-04 ENCOUNTER — Telehealth: Payer: Self-pay | Admitting: *Deleted

## 2023-08-04 DIAGNOSIS — F410 Panic disorder [episodic paroxysmal anxiety] without agoraphobia: Secondary | ICD-10-CM | POA: Insufficient documentation

## 2023-08-04 DIAGNOSIS — G894 Chronic pain syndrome: Secondary | ICD-10-CM | POA: Insufficient documentation

## 2023-08-04 DIAGNOSIS — M419 Scoliosis, unspecified: Secondary | ICD-10-CM | POA: Insufficient documentation

## 2023-08-04 DIAGNOSIS — R42 Dizziness and giddiness: Secondary | ICD-10-CM | POA: Insufficient documentation

## 2023-08-04 DIAGNOSIS — I491 Atrial premature depolarization: Secondary | ICD-10-CM | POA: Insufficient documentation

## 2023-08-04 DIAGNOSIS — R21 Rash and other nonspecific skin eruption: Secondary | ICD-10-CM | POA: Insufficient documentation

## 2023-08-04 DIAGNOSIS — R1314 Dysphagia, pharyngoesophageal phase: Secondary | ICD-10-CM | POA: Insufficient documentation

## 2023-08-04 DIAGNOSIS — E282 Polycystic ovarian syndrome: Secondary | ICD-10-CM | POA: Insufficient documentation

## 2023-08-04 DIAGNOSIS — F32A Depression, unspecified: Secondary | ICD-10-CM | POA: Insufficient documentation

## 2023-08-04 DIAGNOSIS — R002 Palpitations: Secondary | ICD-10-CM | POA: Insufficient documentation

## 2023-08-04 DIAGNOSIS — F4310 Post-traumatic stress disorder, unspecified: Secondary | ICD-10-CM | POA: Insufficient documentation

## 2023-08-04 DIAGNOSIS — F419 Anxiety disorder, unspecified: Secondary | ICD-10-CM | POA: Insufficient documentation

## 2023-08-04 DIAGNOSIS — E349 Endocrine disorder, unspecified: Secondary | ICD-10-CM | POA: Insufficient documentation

## 2023-08-04 DIAGNOSIS — Z9109 Other allergy status, other than to drugs and biological substances: Secondary | ICD-10-CM | POA: Insufficient documentation

## 2023-08-04 DIAGNOSIS — R5383 Other fatigue: Secondary | ICD-10-CM | POA: Insufficient documentation

## 2023-08-04 NOTE — Telephone Encounter (Signed)
Please review Ann Christensen's labs, they have returned and she is concerned with the results. Thanks!

## 2023-08-05 ENCOUNTER — Encounter: Payer: Self-pay | Admitting: *Deleted

## 2023-08-08 ENCOUNTER — Ambulatory Visit: Payer: No Typology Code available for payment source

## 2023-08-08 VITALS — BP 118/80 | HR 69 | Ht 63.0 in | Wt 181.6 lb

## 2023-08-08 DIAGNOSIS — R002 Palpitations: Secondary | ICD-10-CM

## 2023-08-08 NOTE — Patient Instructions (Signed)
Medication Instructions:  Your physician recommends that you continue on your current medications as directed. Please refer to the Current Medication list given to you today.  *If you need a refill on your cardiac medications before your next appointment, please call your pharmacy*   Lab Work: None Ordered If you have labs (blood work) drawn today and your tests are completely normal, you will receive your results only by: MyChart Message (if you have MyChart) OR A paper copy in the mail If you have any lab test that is abnormal or we need to change your treatment, we will call you to review the results.   Testing/Procedures: Echocardiogram An echocardiogram is a test that uses sound waves (ultrasound) to produce images of the heart. Images from an echocardiogram can provide important information about: Heart size and shape. The size and thickness and movement of your heart's walls. Heart muscle function and strength. Heart valve function or if you have stenosis. Stenosis is when the heart valves are too narrow. If blood is flowing backward through the heart valves (regurgitation). A tumor or infectious growth around the heart valves. Areas of heart muscle that are not working well because of poor blood flow or injury from a heart attack. Aneurysm detection. An aneurysm is a weak or damaged part of an artery wall. The wall bulges out from the normal force of blood pumping through the body. Tell a health care provider about: Any allergies you have. All medicines you are taking, including vitamins, herbs, eye drops, creams, and over-the-counter medicines. Any blood disorders you have. Any surgeries you have had. Any medical conditions you have. Whether you are pregnant or may be pregnant. What are the risks? Generally, this is a safe test. However, problems may occur, including an allergic reaction to dye (contrast) that may be used during the test. What happens before the test? No  specific preparation is needed. You may eat and drink normally. What happens during the test?  You will take off your clothes from the waist up and put on a hospital gown. Electrodes or electrocardiogram (ECG)patches may be placed on your chest. The electrodes or patches are then connected to a device that monitors your heart rate and rhythm. You will lie down on a table for an ultrasound exam. A gel will be applied to your chest to help sound waves pass through your skin. A handheld device, called a transducer, will be pressed against your chest and moved over your heart. The transducer produces sound waves that travel to your heart and bounce back (or "echo" back) to the transducer. These sound waves will be captured in real-time and changed into images of your heart that can be viewed on a video monitor. The images will be recorded on a computer and reviewed by your health care provider. You may be asked to change positions or hold your breath for a short time. This makes it easier to get different views or better views of your heart. In some cases, you may receive contrast through an IV in one of your veins. This can improve the quality of the pictures from your heart. The procedure may vary among health care providers and hospitals. What can I expect after the test? You may return to your normal, everyday life, including diet, activities, and medicines, unless your health care provider tells you not to do that. Follow these instructions at home: It is up to you to get the results of your test. Ask your health care provider,  or the department that is doing the test, when your results will be ready. Keep all follow-up visits. This is important. Summary An echocardiogram is a test that uses sound waves (ultrasound) to produce images of the heart. Images from an echocardiogram can provide important information about the size and shape of your heart, heart muscle function, heart valve function, and  other possible heart problems. You do not need to do anything to prepare before this test. You may eat and drink normally. After the echocardiogram is completed, you may return to your normal, everyday life, unless your health care provider tells you not to do that. This information is not intended to replace advice given to you by your health care provider. Make sure you discuss any questions you have with your health care provider. Document Revised: 03/18/2021 Document Reviewed: 02/26/2020 Elsevier Patient Education  2023 Elsevier Inc.        Follow-Up: At [facility], you and your health needs are our priority.  As part of our continuing mission to provide you with exceptional heart care, we have created designated Provider Care Teams.  These Care Teams include your primary Cardiologist (physician) and Advanced Practice Providers (APPs -  Physician Assistants and Nurse Practitioners) who all work together to provide you with the care you need, when you need it.  We recommend signing up for the patient portal called "MyChart".  Sign up information is provided on this After Visit Summary.  MyChart is used to connect with patients for Virtual Visits (Telemedicine).  Patients are able to view lab/test results, encounter notes, upcoming appointments, etc.  Non-urgent messages can be sent to your provider as well.   To learn more about what you can do with MyChart, go to [email or web address].    Your next appointment:   Follow up as needed

## 2023-08-08 NOTE — Assessment & Plan Note (Signed)
Symptoms going on for about 6 months.  Intermittent. Not associated with any syncopal episodes. Sensation of irregular heartbeat lasting for few seconds.  Zio patch results from PCPs office not yet available, requested the tracings and report for our review.  Will obtain transthoracic echocardiogram to assess cardiac structure and function. Will review the results and further follow-up based on test results.

## 2023-08-08 NOTE — Progress Notes (Addendum)
Cardiology Consultation:    Date:  08/08/2023   ID:  Ann Christensen, Ann Christensen 06/30/1986, MRN [id number]  PCP:  Ann Mares, PA  Cardiologist:  Ann Corporal [place] Dani, MD   Referring MD: Ann Christensen, [place]   No chief complaint on file.    ASSESSMENT AND PLAN:   [name] 38 year old with history of ADD, chronic pain, panic disorder, anxiety, asthma, concussions in the past presenting for further evaluation of palpitations that she reported.  Zio patch monitor for 2 weeks done through PCPs office, results not available yet at this time.  She was reportedly symptomatic during the time of Zio patch monitor and activated the device multiple times.  Problem List Items Addressed This Visit     Palpitations - Primary   Symptoms going on for about 6 months.  Intermittent. Not associated with any syncopal episodes. Sensation of irregular heartbeat lasting for few seconds.  Zio patch results from PCPs office not yet available, requested the tracings and report for our review.  Will obtain transthoracic echocardiogram to assess cardiac structure and function. Will review the results and further follow-up based on test results.       Relevant Orders   EKG 12-Lead (Completed)   ECHOCARDIOGRAM COMPLETE   Addendum 08-08-2023 2:16 PM: Event monitor results available from PCPs office scanned copy reviewed. Predominantly sinus rhythm with average heart rate 71/min [ranging from 41 bpm to 133 bpm. Rare ventricular ectopy less than 1% burden. Supraventricular ectopy burden rare, less than 1%. There were 2 short runs of supraventricular tachycardia reported with the longest episode lasting 16 beats. There were total of 41 patient triggers and 3 diary entries most of which correlated with sinus rhythm with heart rate ranging between 53 bpm to 112 bpm and with isolated supraventricular and ventricular ectopic beats.  There was 1 instance that appears to correlate with ectopic atrial  rhythm at a heart rate of 57/min occurred on 07/03/2023 at 5:53 AM and lasted for 13 beats].  Overall no major abnormalities that would explain her symptoms.   History of Present Illness:    Ann Christensen is a 38 y.o. female who is being seen today for the evaluation of palpitations at the request of Ann Christensen, [place], [place].   Pleasant woman here for the visit by herself.  She lives with her wife at home.  She trains horses for competitions.  History of ADD, chronic pain, panic disorder, anxiety, asthma, concussions in the past.   Reportedly for symptoms of palpitations she has had Zio patch monitoring done, results currently pending.  Will reach out to PCPs office to obtain these.  She describes her symptoms as sense of tiredness, lack of energy for the past 6 months or so.  This she mentions close with initially noticed after an onset of rash that started over her lower extremities and extended to her lower back.  She describes symptoms of palpitations which are more frequently notable at night when she lays down, describes it as a sensation of irregular heartbeat that lasts for few seconds.  At times associated with a sense of discomfort and shortness of breath.  No sustained episodes.  No syncopal episodes.  Occasionally lightheaded.    No syncopal episodes.  Does not smoke. Rare alcohol consumption. Has chocolate routinely. No illicit drug use.  EKG in the clinic today shows sinus rhythm heart rate 69/min, normal PR interval normal QRS duration, normal QTc 394 ms.  Blood work results from 06-13-2023 BUN  13, creatinine 0.86, EGFR 89 Sodium 138, potassium 4 Normal transaminases and alkaline phosphatase Hemoglobin 13.6, hematocrit 40.7, WBC 8.7, platelets 359 Lipid panel total cholesterol 218, triglycerides 108, HDL 70, LDL calculated 130. TSH normal 1.84, normal Past Medical History:  Diagnosis Date   Acne vulgaris 05/17/2019   ADHD (attention deficit hyperactivity  disorder) 09/09/2016   Anxiety    Anxiety disorder    Asthma    Chronic pain syndrome    Controlled substance agreement signed 01/03/2018   Depression    Depression, recurrent (HCC) 09/09/2016   Diaphragmatic hernia 09/28/2007   Qualifier: Diagnosis of   By: Fleet Contras LPN, Doran Clay SNOMED Dx Update Oct 2024     Dizziness    Environmental allergies    GERD 09/28/2007   Qualifier: Diagnosis of   By: Fleet Contras LPN, Deborah         Health care maintenance 12/08/2016   Hormone disorder    Insomnia 09/09/2016   IRRITABLE BOWEL SYNDROME 09/28/2007   Qualifier: Diagnosis of   By: Fleet Contras LPN, Deborah         Multiple food allergies 07/05/2018   Other fatigue    PAC (premature atrial contraction)    Palpitations    Panic disorder    PCOS (polycystic ovarian syndrome)    Pharyngoesophageal dysphagia    PTSD (post-traumatic stress disorder)    Scoliosis    Skin rash     Past Surgical History:  Procedure Laterality Date   TONSILLECTOMY AND ADENOIDECTOMY     She was 38 years old   WISDOM TOOTH EXTRACTION  2011    Current Medications: Current Meds  Medication Sig   albuterol (VENTOLIN HFA) 108 (90 Base) MCG/ACT inhaler INHALE 2 PUFFS BY MOUTH EVERY 4 TO 6 HOURS AS NEEDED FOR WHEEZE OR SHORTNESS OF BREATH   buPROPion (WELLBUTRIN XL) 150 MG 24 hr tablet Take 150 mg by mouth daily.     Allergies:   Bee venom and Latex   Social History   Socioeconomic History   Marital status: Married    Spouse name: Not on file   Number of children: Not on file   Years of education: Not on file   Highest education level: Not on file  Occupational History   Not on file  Tobacco Use   Smoking status: Never    Passive exposure: Never   Smokeless tobacco: Never  Vaping Use   Vaping status: Never Used  Substance and Sexual Activity   Alcohol use: Yes    Comment: social   Drug use: Yes    Types: Marijuana    Comment: pain   Sexual activity: Yes    Partners: Female    Birth  control/protection: Pill  Other Topics Concern   Not on file  Social History Narrative   Not on file   Social Drivers of Health   Financial Resource Strain: Not on file  Food Insecurity: Not on file  Transportation Needs: Not on file  Physical Activity: Not on file  Stress: Not on file  Social Connections: Not on file     Family History: The patient's family history includes Alcohol abuse in her father; Breast cancer (age of onset: 79) in her mother; Cancer in her maternal grandmother; Diabetes in her paternal grandmother; Hypertension in her father and mother; Sinusitis in her sister; Thyroid disease in her maternal aunt. There is no history of Asthma. ROS:   Please see the history of present illness.  All 14 point review of systems negative except as described per history of present illness.  EKGs/Labs/Other Studies Reviewed:    The following studies were reviewed today:   EKG:  EKG Interpretation Date/Time:  Monday August 08 2023 09:41:34 EST Ventricular Rate:  69 PR Interval:  136 QRS Duration:  66 QT Interval:  368 QTC Calculation: 394 R Axis:   92  Text Interpretation: Normal sinus rhythm Rightward axis Borderline ECG No previous ECGs available Confirmed by Huntley Dec reddy [id number]) on 08/08/2023 9:47:33 AM    Recent Labs: No results found for requested labs within last 365 days.  Recent Lipid Panel    Component Value Date/Time   CHOL 199 01/31/2020 1154   TRIG 65 01/31/2020 1154   HDL 80 01/31/2020 1154   CHOLHDL 2.5 01/31/2020 1154   LDLCALC 107 (H) 01/31/2020 1154    Physical Exam:    VS:  BP 118/80   Pulse 69   Ht 5\' 3"  (1.6 m)   Wt 181 lb 9.6 oz (82.4 kg)   SpO2 99%   BMI 32.17 kg/m     Wt Readings from Last 3 Encounters:  08/08/23 181 lb 9.6 oz (82.4 kg)  07/26/23 178 lb (80.7 kg)  01/31/20 151 lb (68.5 kg)     GENERAL:  Well nourished, well developed in no acute distress NECK: No JVD; No carotid bruits CARDIAC: RRR, S1 and S2  present, no murmurs, no rubs, no gallops CHEST:  Clear to auscultation without rales, wheezing or rhonchi  Extremities: No pitting pedal edema. Pulses bilaterally symmetric with radial 2+ and dorsalis pedis 2+ NEUROLOGIC:  Alert and oriented x 3  Medication Adjustments/Labs and Tests Ordered: Current medicines are reviewed at length with the patient today.  Concerns regarding medicines are outlined above.  Orders Placed This Encounter  Procedures   EKG 12-Lead   ECHOCARDIOGRAM COMPLETE   No orders of the defined types were placed in this encounter.   Signed, Cecille Amsterdam, MD, MPH, [facility]. 08/08/2023 11:40 AM    [facility]

## 2023-08-12 ENCOUNTER — Other Ambulatory Visit: Payer: Self-pay | Admitting: *Deleted

## 2023-08-12 MED ORDER — ALBUTEROL SULFATE HFA 108 (90 BASE) MCG/ACT IN AERS: INHALATION_SPRAY | RESPIRATORY_TRACT | 1 refills | Status: DC

## 2023-08-25 ENCOUNTER — Ambulatory Visit: Payer: No Typology Code available for payment source

## 2023-08-25 DIAGNOSIS — R002 Palpitations: Secondary | ICD-10-CM

## 2023-08-25 LAB — ECHOCARDIOGRAM COMPLETE
Area-P 1/2: 3.59 cm2
S' Lateral: 2.6 cm

## 2023-10-11 ENCOUNTER — Ambulatory Visit: Payer: No Typology Code available for payment source | Admitting: Allergy

## 2023-11-08 ENCOUNTER — Encounter: Payer: Self-pay | Admitting: Allergy

## 2023-11-08 ENCOUNTER — Ambulatory Visit: Admitting: Allergy

## 2023-11-08 VITALS — BP 110/60 | HR 63 | Resp 16

## 2023-11-08 DIAGNOSIS — J309 Allergic rhinitis, unspecified: Secondary | ICD-10-CM

## 2023-11-08 DIAGNOSIS — K219 Gastro-esophageal reflux disease without esophagitis: Secondary | ICD-10-CM | POA: Diagnosis not present

## 2023-11-08 DIAGNOSIS — R21 Rash and other nonspecific skin eruption: Secondary | ICD-10-CM | POA: Diagnosis not present

## 2023-11-08 DIAGNOSIS — H1013 Acute atopic conjunctivitis, bilateral: Secondary | ICD-10-CM

## 2023-11-08 DIAGNOSIS — H1010 Acute atopic conjunctivitis, unspecified eye: Secondary | ICD-10-CM

## 2023-11-08 DIAGNOSIS — J4520 Mild intermittent asthma, uncomplicated: Secondary | ICD-10-CM | POA: Diagnosis not present

## 2023-11-08 MED ORDER — TACROLIMUS 0.1 % EX OINT: TOPICAL_OINTMENT | Freq: Two times a day (BID) | CUTANEOUS | 5 refills | Status: DC | PRN

## 2023-11-08 MED ORDER — EPINEPHRINE 0.3 MG/0.3ML IJ SOAJ: 0.3000 mg | INTRAMUSCULAR | 1 refills | Status: AC | PRN

## 2023-11-08 MED ORDER — SPACER/AERO-HOLDING CHAMBERS DEVI: 0 refills | Status: AC

## 2023-11-08 MED ORDER — BUDESONIDE-FORMOTEROL FUMARATE 160-4.5 MCG/ACT IN AERO: 2.0000 | INHALATION_SPRAY | Freq: Two times a day (BID) | RESPIRATORY_TRACT | 5 refills | Status: DC

## 2023-11-08 NOTE — Patient Instructions (Addendum)
 Recurrent Rash Patient has a history of recurrent rash, previously treated with antifungals, steroids, and wound care. The rash has been present since July and has been resistant to treatment. The rash is located on various parts of the body including the hips, chest, and behind the ear. The patient has been on Valacyclovir and Gabapentin, but the cause of the rash is still unclear. -Referral made to dermatology for further evaluation and biopsy - will request these records for review -Immune function labs showed strep pneumonia titers with a 30% protective titer rate which is low for an adult.  Would recommend she have a booster vaccine of strep pneumonia.  Would prefer Pneumovax in order to be able to repeat titers afterwards and see if she has mounted an appropriate response.   Will call you once we have pneumovax in this office to provide.   -Consider add-on therapy like Dupixent injections for if the rash is determined to be a long-term issue and consistent biopsy with such treatment.  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 The patient has been managing symptoms with over-the-counter antihistamines. -Perform avoidance measures for dust mites, molds, grass pollen, tree pollen, weed pollen, rodents, cat dander, dog dander, cockroach.  -Stinging insect panel was positive to  white faced hornet, yellow hornet, honeybee, yellowjacket, paper wasp as well as fire  ant IgE.   Continue avoidance as much as possible and have access to Epipen   0.3mg  device in case of allergic reaction and follow emergency action plan.  -Recommend use of cetirizine 10mg  take 2 tabs daily during pollen season -Use Ryaltris nasal spray 2 sprays each nostril twice a day for nasal congestion or drainage.  This is a combination spray with nasal steroid for congestion control and nasal antihistamine for drainage control.  With using nasal sprays point tip of bottle  toward eye on same side nostril and lean head slightly forward for best technique.   -Provided/discussed allergy shots today and you can check with insurance carrier and if covered you can schedule for new start appointment for allergy shots when ready.  This is a 3-5 year therapy to reduce your reactivity to environmental allergens.   Asthma Patient has a history of asthma and has been on Montelukast, but stopped due to side effect profile. The patient has been using an Albuterol  inhaler as needed. -Have access to albuterol  inhaler 2 puffs every 4-6 hours as needed for cough/wheeze/shortness of breath/chest tightness.  May use 15-20 minutes prior to activity.   Monitor frequency of use.   -Start daily maintenance inhaler at this time like Symbicort  160mcg 2 puffs twice a day with spacer device.   GERD Patient has a history of severe GERD and has been taking Famotidine. -Continue Omeprazole and Famotidine as needed.  Follow-up in 3-4 months or sooner if needed

## 2023-11-08 NOTE — Progress Notes (Signed)
 Follow-up Note  RE: Ann Christensen MRN: [id number] DOB: 1985/10/04 Date of Office Visit: 11/08/2023   History of present illness: Ann Christensen is a 38 y.o. female presenting today for follow-up of rash, allergic rhinitis, asthma, GERD.  She was last seen in the office on 07/26/23 by myself.  Discussed the use of AI scribe software for clinical note transcription with the patient, who gave verbal consent to proceed.  She has a history of skin lesions and she has had biopsies done by dermatology.  She reports the biopsy showed eczema.  She states she has not received detailed information about the type of eczema or its causes.  She uses triamcinolone, a topical steroid, every other day, taking breaks to avoid overuse. Despite treatment, she continues to have unhealed sores and new lesions. She is cautious about steroid use due to past significant weight gain with injectable steroids.    She has a history of allergies, with a recent allergy panel showing positive results for dust mites, cat, dog, grasses, molds, tree pollen, weed pollen, and rodents. She experiences severe allergy symptoms, including waking up choking on mucus, and uses cetirizine twice daily. Chlortab is used for breakthrough symptoms. 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  She uses her rescue inhaler four to five times a day and has stopped using Advair and Singulair due to side effects, including dizziness and vertigo however she is not entirely sure which medication led to the symptoms.. She is concerned about the side effects of inhaled steroids but acknowledges the need for better asthma control.  She has a history of GERD and uses famotidine and omeprazole as needed. She has modified her sleeping position to alleviate symptoms.  She has a low titer to strep pneumonia and plans to receive a booster once it is available at her clinic.      Review of systems: 10pt ROS negative  unless noted above in HPI   Past medical/social/surgical/family history have been reviewed and are unchanged unless specifically indicated below.  No changes  Medication List: Current Outpatient Medications  Medication Sig Dispense Refill   albuterol  (VENTOLIN  HFA) 108 (90 Base) MCG/ACT inhaler INHALE 2 PUFFS BY MOUTH EVERY 4 TO 6 HOURS AS NEEDED FOR WHEEZE OR SHORTNESS OF BREATH 3 each 1   budesonide -formoterol  (SYMBICORT ) 160-4.5 MCG/ACT inhaler Inhale 2 puffs into the lungs 2 (two) times daily. Use with spacer device 10.2 g 5   buPROPion  (WELLBUTRIN  XL) 150 MG 24 hr tablet Take 150 mg by mouth daily.     EPINEPHrine  0.3 mg/0.3 mL IJ SOAJ injection Inject 0.3 mg into the muscle as needed for anaphylaxis. As needed for life-threatening allergic reactions 2 each 1   Spacer/Aero-Holding [facility] Use as directed with inhaler 1 each 0   tacrolimus  (PROTOPIC ) 0.1 % ointment Apply topically 2 (two) times daily as needed. 100 g 5   triamcinolone ointment (KENALOG) 0.5 % Apply topically 2 (two) times daily.     No current facility-administered medications for this visit.     Known medication allergies: Allergies  Allergen Reactions   Bee Venom    Latex Rash    Adhesives      Physical examination: Blood pressure 110/60, pulse 63, resp. rate 16, SpO2 97%.  General: Alert, interactive, in no acute distress. HEENT: PERRLA, TMs pearly gray, turbinates moderately edematous without discharge, post-pharynx non erythematous. Neck: Supple without lymphadenopathy. Lungs: Clear to auscultation without wheezing, rhonchi or rales. {no increased work  of breathing. CV: Normal S1, S2 without murmurs. Abdomen: Nondistended, nontender. Skin: Healed round skin lesions on the lower leg, upper back . Extremities:  No clubbing, cyanosis or edema. Neuro:   Grossly intact.  Diagnositics/Labs: Labs:  Component     Latest Ref Rng 07/26/2023  IgE (Immunoglobulin E), Serum     6 - 495 IU/mL 596 (H)    D Pteronyssinus IgE     Class IV kU/L 16.90 !   D Farinae IgE     Class IV kU/L 8.82 !   Cat Dander IgE     Class I kU/L 0.43 !   Dog Dander IgE     Class I kU/L 0.40 !   [place] Grass IgE     Class III kU/L 1.41 !   Timothy Grass IgE     Class III kU/L 2.67 !   Johnson Grass IgE     Class III kU/L 1.63 !   Cockroach, [place] IgE     Class 0/I kU/L 0.21 !   Penicillium Chrysogen IgE     Class II kU/L 1.32 !   Cladosporium Herbarum IgE     Class III kU/L 3.72 !   Aspergillus Fumigatus IgE     Class II kU/L 1.38 !   Alternaria Alternata IgE     Class V kU/L 33.30 !   Maple/Box Elder IgE     Class II kU/L 0.58 !   Common Silver Amelia Jurist IgE     Class II kU/L 1.25 !   [place], [place] IgE     Class III kU/L 1.74 !   Oak, [place] IgE     Class II kU/L 0.58 !   Elm, American IgE     Class III kU/L 1.66 !   Cot[place]wood IgE     Class II kU/L 0.78 !   Pecan, [place] IgE     Class II kU/L 1.38 !   White Mulberry IgE     Class 0 kU/L <0.10   Ragweed, Short IgE     Class III kU/L 1.49 !   Pigweed, Rough IgE     Class II kU/L 1.01 !   Sheep Sorrel IgE Qn     Class III kU/L 2.40 !   Mouse Urine IgE     Class III kU/L 1.53 !   Pneumo Ab Type 1*     >1.3 ug/mL 1.8   Pneumo Ab Type 3*     >1.3 ug/mL <0.1 (L)   Pneumo Ab Type 4*     >1.3 ug/mL <0.1 (L)   Pneumo Ab Type 8*     >1.3 ug/mL 3.0   Pneumo Ab Type 9 (9N)*     >1.3 ug/mL 4.1   Pneumo Ab Type 12 (80F)*     >1.3 ug/mL <0.1 (L)   Pneumo Ab Type 14*     >1.3 ug/mL 1.6   Pneumo Ab Type 17 (68F)*     >1.3 ug/mL 0.6 (L)   Pneumo Ab Type 19 (53F)*     >1.3 ug/mL 1.8   Pneumo Ab Type 2*     >1.3 ug/mL 1.2 (L)   Pneumo Ab Type 20*     >1.3 ug/mL 1.6   Pneumo Ab Type 22 (5F)*     >1.3 ug/mL <0.1 (L)   Pneumo Ab Type 23 (17F)*     >1.3 ug/mL 0.7 (L)   Pneumo Ab Type 26 (6B)*     >1.3 ug/mL <0.1 (L)   Pneumo  Ab Type 34 (10A)*     >1.3 ug/mL <0.1 (L)   Pneumo Ab Type 43 (11A)*     >1.3 ug/mL 0.1 (L)   Pneumo Ab  Type 5*     >1.3 ug/mL 0.3 (L)   Pneumo Ab Type 51 (28F)*     >1.3 ug/mL 0.3 (L)   Pneumo Ab Type 54 (15B)*     >1.3 ug/mL 1.1 (L)   Pneumo Ab Type 56 (18C)*     >1.3 ug/mL <0.1 (L)   Pneumo Ab Type 57 (19A)*     >1.3 ug/mL 1.0 (L)   Pneumo Ab Type 68 (9V)*     >1.3 ug/mL 2.8   Pneumo Ab Type 70 (31F)*     >1.3 ug/mL 0.2 (L)   Class Description Allergens Comment   Bumblebee     Class 0/I kU/L 0.16 !   Hornet, White Face, IgE     Class II kU/L 0.69 !   Hornet, Yellow, IgE     Class I kU/L 0.41 !   I001-IgE Honeybee     Class 0/I kU/L 0.31 !   I208-IgE Api m 1     Class 0 kU/L <0.10   I214-IgE Api m 2     Class 0 kU/L <0.10   I215-IgE Api m 3     Class I kU/L 0.38 !   I216-IgE Api m 5     Class 0 kU/L <0.10   I217-IgE Api m 10     Class 0 kU/L <0.10   Yellow Jacket, IgE     Class III kU/L 2.47 !   I211-IgE Ves v 1     Class III kU/L 2.35 !   I209-IgE Ves v 5     Class II kU/L 0.74 !   Paper Wasp IgE     Class III kU/L 3.59 !   I210-IgE Pol d 5     Class III kU/L 1.63 !   Tryptase     2.2 - 13.2 ug/L 4.4   IgG (Immunoglobin G), Serum     586 - 1,602 mg/dL 1,308   IgA/Immunoglobulin A, Serum     87 - 352 mg/dL 657   IgM (Immunoglobulin M), Srm     26 - 217 mg/dL 846 (H)   Tetanus Ab, IgG     <0.10 IU/mL 0.70   Diphtheria Ab     <0.10 IU/mL 0.27   I070-IgE Fire  Ant (Invicta)     Class III kU/L 2.24 !   Allergen Comments Note     Assessment and plan:   Recurrent Rash Patient has a history of recurrent rash, previously treated with antifungals, steroids, and wound care. The rash has been present since July and has been resistant to treatment. The rash is located on various parts of the body including the hips, chest, and behind the ear. The patient has been on Valacyclovir and Gabapentin, but the cause of the rash is still unclear. -Referral made to dermatology for further evaluation and biopsy - will request these records for review -Immune function labs  showed strep pneumonia titers with a 30% protective titer rate which is low for an adult.  Would recommend she have a booster vaccine of strep pneumonia.  Would prefer Pneumovax in order to be able to repeat titers afterwards and see if she has mounted an appropriate response.   Will call you once we have pneumovax in this office to provide.   -Consider add-on therapy like Dupixent injections  for if the rash is determined to be a long-term issue and consistent biopsy with such treatment.  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 The patient has been managing symptoms with over-the-counter antihistamines. -Perform avoidance measures for dust mites, molds, grass pollen, tree pollen, weed pollen, rodents, cat dander, dog dander, cockroach.  -Stinging insect panel was positive to  white faced hornet, yellow hornet, honeybee, yellowjacket, paper wasp as well as fire  ant IgE.   Continue avoidance as much as possible and have access to Epipen   0.3mg  device in case of allergic reaction and follow emergency action plan.  -Recommend use of cetirizine 10mg  take 2 tabs daily during pollen season -Use Ryaltris nasal spray 2 sprays each nostril twice a day for nasal congestion or drainage.  This is a combination spray with nasal steroid for congestion control and nasal antihistamine for drainage control.  With using nasal sprays point tip of bottle toward eye on same side nostril and lean head slightly forward for best technique.   -Provided/discussed allergy shots today and you can check with insurance carrier and if covered you can schedule for new start appointment for allergy shots when ready.  This is a 3-5 year therapy to reduce your reactivity to environmental allergens.   Asthma Patient has a history of asthma and has been on Montelukast, but stopped due to side effect profile. The patient has been using an  Albuterol  inhaler as needed. -Have access to albuterol  inhaler 2 puffs every 4-6 hours as needed for cough/wheeze/shortness of breath/chest tightness.  May use 15-20 minutes prior to activity.   Monitor frequency of use.   -Start daily maintenance inhaler at this time like Symbicort  160mcg 2 puffs twice a day with spacer device.   GERD Patient has a history of severe GERD and has been taking Famotidine. -Continue Omeprazole and Famotidine as needed.  Follow-up in 3-4 months or sooner if needed  I appreciate the opportunity to take part in Takara's care. Please do not hesitate to contact me with questions.  Sincerely,   Catha Clink, MD Allergy/Immunology [facility]

## 2023-11-21 ENCOUNTER — Telehealth: Payer: Self-pay | Admitting: *Deleted

## 2023-11-21 NOTE — Progress Notes (Signed)
 Patient states she will call us  back to let us  know when she is able to come in for the vaccine .

## 2023-11-21 NOTE — Telephone Encounter (Signed)
 Patient called and advised she wanted tomove forward with Dupixent, I reached out to her to advise approval, copay card and submit to [facility]. Instructed on delivery, storage and initial dose in clinic.

## 2023-12-06 MED ORDER — DUPIXENT 300 MG/2ML ~~LOC~~ SOSY: 600.0000 mg | PREFILLED_SYRINGE | Freq: Once | SUBCUTANEOUS | 11 refills | Status: AC

## 2023-12-06 NOTE — Telephone Encounter (Signed)
 Per [facility] cannot service rx new rx sent to [facility]

## 2023-12-06 NOTE — Addendum Note (Signed)
 Addended by: Evangelina Hilt on: 12/06/2023 12:20 PM   Modules accepted: Orders

## 2023-12-09 ENCOUNTER — Other Ambulatory Visit: Payer: Self-pay | Admitting: Allergy

## 2023-12-13 ENCOUNTER — Other Ambulatory Visit: Payer: Self-pay | Admitting: Allergy

## 2023-12-27 ENCOUNTER — Encounter: Payer: Self-pay | Admitting: Allergy

## 2023-12-27 ENCOUNTER — Telehealth: Payer: Self-pay | Admitting: *Deleted

## 2023-12-27 ENCOUNTER — Ambulatory Visit (INDEPENDENT_AMBULATORY_CARE_PROVIDER_SITE_OTHER): Admitting: Allergy

## 2023-12-27 VITALS — BP 116/72 | HR 74 | Temp 98.1°F | Resp 12

## 2023-12-27 DIAGNOSIS — T781XXD Other adverse food reactions, not elsewhere classified, subsequent encounter: Secondary | ICD-10-CM

## 2023-12-27 DIAGNOSIS — H1010 Acute atopic conjunctivitis, unspecified eye: Secondary | ICD-10-CM

## 2023-12-27 DIAGNOSIS — J309 Allergic rhinitis, unspecified: Secondary | ICD-10-CM

## 2023-12-27 DIAGNOSIS — R21 Rash and other nonspecific skin eruption: Secondary | ICD-10-CM

## 2023-12-27 DIAGNOSIS — H1013 Acute atopic conjunctivitis, bilateral: Secondary | ICD-10-CM

## 2023-12-27 DIAGNOSIS — K219 Gastro-esophageal reflux disease without esophagitis: Secondary | ICD-10-CM | POA: Diagnosis not present

## 2023-12-27 DIAGNOSIS — J4520 Mild intermittent asthma, uncomplicated: Secondary | ICD-10-CM | POA: Diagnosis not present

## 2023-12-27 MED ORDER — DUPILUMAB 300 MG/2ML ~~LOC~~ SOSY
600.0000 mg | PREFILLED_SYRINGE | Freq: Once | SUBCUTANEOUS | Status: AC
  Administered 2023-12-27: 600 mg via SUBCUTANEOUS

## 2023-12-27 NOTE — Progress Notes (Signed)
 Follow-up Note  RE: Ann Christensen MRN: [id number] DOB: 27-Jun-1986 Date of Office Visit: 12/27/2023   History of present illness: Ann Christensen is a 38 y.o. female presenting today for follow-up of recurrent rash, allergic rhinitis with conjunctivitis, asthma, GERD.  She also has history of persistent joint, inflammation, dysautonomia.  She also recently took a EDS questionnaire and was positive per this questionnaire.  She was last seen in my office on 11/08/2023. Discussed the use of AI scribe software for clinical note transcription with the patient, who gave verbal consent to proceed.  After the last that she was approved for Dupixent  but has delayed starting it due to concerns about potential side effects.  However after doing more research about a she is comfortable at this time is starting today in office.  She brought in her Dupixent  box today.  She has a history of generalized inflammation and joint pain.after last visit to see if we can improve her respiratory symptoms I asked her to try Symbicort  for maintenance use however she is quite sensitive to steroids and was noticing increase in joint pain.  After stopping Symbicort  this did improve back to her baseline pain.  She does note however that her breathing while on it was improved but she felt like it was exacerbating her joint inflammation and caused fluid retention.  She still have access to albuterol  that she will use for as needed purposes. Currently, she uses her antihistamine cetirizine four times a day which she does feel has helped her general symptoms.. She has a history of multiple allergies, including a known sensitivity to latex and adhesives, which cause blisters. She suspects a possible allergy to spandex, similar to her mother's experience post-chemotherapy.  She has contact with spandex within 48 hours-will cause her skin to blister.  She is considering further allergy testing, including patch testing for contact  allergens and IgE testing for food allergies. Since October, she has experienced symptoms consistent with dysautonomia, including heart palpitations and dizziness upon bending over which is like is worsening. Previous cardiology evaluations included heart monitoring and echocardiograms, but no tilt table test or other tests in regards to dysautonomia was performed. She is concerned about potential food allergies contributing to her inflammation and is interested in testing for specific foods. She also reports a history of oral allergy syndrome, experiencing tingling and itching with certain fresh fruits and vegetables.  Review of systems: 10pt ROS negative unless noted above in HPI   Past medical/social/surgical/family history have been reviewed and are unchanged unless specifically indicated below.  No changes  Medication List: Current Outpatient Medications  Medication Sig Dispense Refill   albuterol  (VENTOLIN  HFA) 108 (90 Base) MCG/ACT inhaler INHALE 2 PUFFS BY MOUTH EVERY 4 TO 6 HOURS AS NEEDED FOR WHEEZE OR SHORTNESS OF BREATH 3 each 1   Amphet-Dextroamphet 3-Bead ER 37.5 MG CP24 Take 1 capsule by mouth every morning.     buPROPion  (WELLBUTRIN  XL) 150 MG 24 hr tablet Take 150 mg by mouth daily.     CHARLOTTE 24 FE 1-20 MG-MCG(24) CHEW Chew 1 tablet by mouth daily.     dupilumab  (DUPIXENT ) 300 MG/2ML prefilled syringe Inject 600mg  subcutaneous once then 300mg  every 14 days 4 mL 11   EPINEPHrine  0.3 mg/0.3 mL IJ SOAJ injection Inject 0.3 mg into the muscle as needed for anaphylaxis. As needed for life-threatening allergic reactions 2 each 1   omeprazole (PRILOSEC) 40 MG capsule Take 40 mg by mouth daily.  Spacer/Aero-Holding Ismael Maria Use as directed with inhaler 1 each 0   triamcinolone ointment (KENALOG) 0.5 % Apply topically 2 (two) times daily.     budesonide -formoterol  (SYMBICORT ) 160-4.5 MCG/ACT inhaler Inhale 2 puffs into the lungs 2 (two) times daily. Use with spacer device  (Patient not taking: Reported on 12/27/2023) 10.2 g 5   No current facility-administered medications for this visit.     Known medication allergies: Allergies  Allergen Reactions   Bee Venom    Latex Rash    Adhesives      Physical examination: Blood pressure 116/72, pulse 74, temperature 98.1 F (36.7 C), temperature source Temporal, resp. rate 12, SpO2 98%.  General: Alert, interactive, in no acute distress. HEENT: PERRLA, TMs pearly gray, turbinates mildly edematous without discharge, post-pharynx non erythematous. Neck: Supple without lymphadenopathy. Lungs: Clear to auscultation without wheezing, rhonchi or rales. {no increased work of breathing. CV: Normal S1, S2 without murmurs. Abdomen: Nondistended, nontender. Skin: Warm and dry, without lesions or rashes. Extremities:  No clubbing, cyanosis or edema. Neuro:   Grossly intact.  Diagnostics/Labs: Labs:  Component     Latest Ref Rng 07/26/2023  IgE (Immunoglobulin E), Serum     6 - 495 IU/mL 596 (H)   D Pteronyssinus IgE     Class IV kU/L 16.90 !   D Farinae IgE     Class IV kU/L 8.82 !   Cat Dander IgE     Class I kU/L 0.43 !   Dog Dander IgE     Class I kU/L 0.40 !   [place] Grass IgE     Class III kU/L 1.41 !   Timothy Grass IgE     Class III kU/L 2.67 !   Johnson Grass IgE     Class III kU/L 1.63 !   Cockroach, [place] IgE     Class 0/I kU/L 0.21 !   Penicillium Chrysogen IgE     Class II kU/L 1.32 !   Cladosporium Herbarum IgE     Class III kU/L 3.72 !   Aspergillus Fumigatus IgE     Class II kU/L 1.38 !   Alternaria Alternata IgE     Class V kU/L 33.30 !   Maple/Box Elder IgE     Class II kU/L 0.58 !   Common Silver Amelia Jurist IgE     Class II kU/L 1.25 !   [place], [place] IgE     Class III kU/L 1.74 !   [place], [place] IgE     Class II kU/L 0.58 !   Elm, American IgE     Class III kU/L 1.66 !   [place] IgE     Class II kU/L 0.78 !   Pecan, [place] IgE     Class II kU/L 1.38 !   White Mulberry  IgE     Class 0 kU/L <0.10   Ragweed, Short IgE     Class III kU/L 1.49 !   Pigweed, Rough IgE     Class II kU/L 1.01 !   Sheep Sorrel IgE Qn     Class III kU/L 2.40 !   Mouse Urine IgE     Class III kU/L 1.53 !   Pneumo Ab Type 1*     >1.3 ug/mL 1.8   Pneumo Ab Type 3*     >1.3 ug/mL <0.1 (L)   Pneumo Ab Type 4*     >1.3 ug/mL <0.1 (L)   Pneumo Ab Type 8*     >1.3  ug/mL 3.0   Pneumo Ab Type 9 (9N)*     >1.3 ug/mL 4.1   Pneumo Ab Type 12 (65F)*     >1.3 ug/mL <0.1 (L)   Pneumo Ab Type 14*     >1.3 ug/mL 1.6   Pneumo Ab Type 17 (69F)*     >1.3 ug/mL 0.6 (L)   Pneumo Ab Type 19 (30F)*     >1.3 ug/mL 1.8   Pneumo Ab Type 2*     >1.3 ug/mL 1.2 (L)   Pneumo Ab Type 20*     >1.3 ug/mL 1.6   Pneumo Ab Type 22 (36F)*     >1.3 ug/mL <0.1 (L)   Pneumo Ab Type 23 (69F)*     >1.3 ug/mL 0.7 (L)   Pneumo Ab Type 26 (6B)*     >1.3 ug/mL <0.1 (L)   Pneumo Ab Type 34 (10A)*     >1.3 ug/mL <0.1 (L)   Pneumo Ab Type 43 (11A)*     >1.3 ug/mL 0.1 (L)   Pneumo Ab Type 5*     >1.3 ug/mL 0.3 (L)   Pneumo Ab Type 51 (96F)*     >1.3 ug/mL 0.3 (L)   Pneumo Ab Type 54 (15B)*     >1.3 ug/mL 1.1 (L)   Pneumo Ab Type 56 (18C)*     >1.3 ug/mL <0.1 (L)   Pneumo Ab Type 57 (19A)*     >1.3 ug/mL 1.0 (L)   Pneumo Ab Type 68 (9V)*     >1.3 ug/mL 2.8   Pneumo Ab Type 70 (25F)*     >1.3 ug/mL 0.2 (L)   Class Description Allergens Comment   Bumblebee     Class 0/I kU/L 0.16 !   Hornet, White Face, IgE     Class II kU/L 0.69 !   Hornet, Yellow, IgE     Class I kU/L 0.41 !   I001-IgE Honeybee     Class 0/I kU/L 0.31 !   I208-IgE Api m 1     Class 0 kU/L <0.10   I214-IgE Api m 2     Class 0 kU/L <0.10   I215-IgE Api m 3     Class I kU/L 0.38 !   I216-IgE Api m 5     Class 0 kU/L <0.10   I217-IgE Api m 10     Class 0 kU/L <0.10   Yellow Jacket, IgE     Class III kU/L 2.47 !   I211-IgE Ves v 1     Class III kU/L 2.35 !   I209-IgE Ves v 5     Class II kU/L 0.74 !   Paper Wasp IgE      Class III kU/L 3.59 !   I210-IgE Pol d 5     Class III kU/L 1.63 !   Tryptase     2.2 - 13.2 ug/L 4.4   IgG (Immunoglobin G), Serum     586 - 1,602 mg/dL 0,865   IgA/Immunoglobulin A, Serum     87 - 352 mg/dL 784   IgM (Immunoglobulin M), Srm     26 - 217 mg/dL 696 (H)   Tetanus Ab, IgG     <0.10 IU/mL 0.70   Diphtheria Ab     <0.10 IU/mL 0.27   I070-IgE Fire  Ant (Invicta)     Class III kU/L 2.24 !   Allergen Comments Note      Start Dupixent  in office today with loading dose of 600 mg.  Tolerated this well.  Assessment and plan:   Recurrent Rash Patient has a history of recurrent rash, previously treated with antifungals, steroids, and wound care. The rash has been present since July and has been resistant to treatment. The rash is located on various parts of the body including the hips, chest, and behind the ear. The patient has been on Valacyclovir and Gabapentin, but the cause of the rash is still unclear. -Referral previously made to dermatology for further evaluation and biopsy  -Immune function labs showed strep pneumonia titers with a 30% protective titer rate which is low for an adult.  Would recommend she have a booster vaccine of strep pneumonia.  Would prefer Pneumovax in order to be able to repeat titers afterwards and see if she has mounted an appropriate response.   Schedule shot appt to receive Pnuemovax.  Get titers done no sooner than 6 weeks after vaccination.  -Starting Dupixent  injections today.  Continue every 2 week 1 injection self-administered.  Use abdomen or thighs and rotate your sites.  -Obtaining serum IgE to select foods today -Consider patch testing for contact allergy evaluation.  Contact dermatitis (contact allergy) -Patch testing is the test of choice to evaluate for contact dermatitis.   Patches are placed on Monday in our [place] office with return to office on Wednesday and Friday of same week for readings.   Readings can be done in [place] office.   Once patches are in place to do not get them wet.  You can take antihistamines while patches are in place.   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 The patient has been managing symptoms with over-the-counter antihistamines. -Perform avoidance measures for dust mites, molds, grass pollen, tree pollen, weed pollen, rodents, cat dander, dog dander, cockroach.  -Stinging insect panel was positive to  white faced hornet, yellow hornet, honeybee, yellowjacket, paper wasp as well as fire  ant IgE.   Continue avoidance as much as possible and have access to Epipen   0.3mg  device in case of allergic reaction and follow emergency action plan.  -Recommend use of cetirizine 10mg  take 2-4 tabs daily -Use Ryaltris nasal spray 2 sprays each nostril twice a day for nasal congestion or drainage.  This is a combination spray with nasal steroid for congestion control and nasal antihistamine for drainage control.  With using nasal sprays point tip of bottle toward eye on same side nostril and lean head slightly forward for best technique.   -Provided/discussed allergy shots today and you can check with insurance carrier and if covered you can schedule for new start appointment for allergy shots when ready.  This is a 3-5 year therapy to reduce your reactivity to environmental allergens.   Asthma Patient has a history of asthma and has been on Montelukast, but stopped due to side effect profile. The patient has been using an Albuterol  inhaler as needed. -Have access to albuterol  inhaler 2 puffs every 4-6 hours as needed for cough/wheeze/shortness of breath/chest tightness.  May use 15-20 minutes prior to activity.   Monitor frequency of use.   -Use Symbicort  160mcg 2 puffs twice a day with spacer device during respiratory illnesses.   GERD Patient has a history of severe GERD and has been  taking Famotidine. -Continue Omeprazole and Famotidine as needed.   The oral allergy syndrome (OAS) or pollen-food allergy syndrome (PFAS) is a relatively common form of food allergy, particularly  in adults. It typically occurs in people who have pollen allergies when the immune system "sees" proteins on the food that look like proteins on the pollen. This results in the allergy antibody (IgE) binding to the food instead of the pollen. Patients typically report itching and/or mild swelling of the mouth and throat immediately following ingestion of certain uncooked fruits (including nuts) or raw vegetables. Only a very small number of affected individuals experience systemic allergic reactions, such as anaphylaxis which occurs with true food allergies.    Will place referrals for cardiology (second opinion) and rheumatology.  Follow-up in 3-4 months or sooner if needed   I appreciate the opportunity to take part in Leslea's care. Please do not hesitate to contact me with questions.  Sincerely,   Catha Clink, MD Allergy/Immunology [facility]

## 2023-12-27 NOTE — Progress Notes (Signed)
 Immunotherapy   Patient Details  Name: MARLYSS CISSELL MRN: [id number] Date of Birth: [birth year]  12/27/2023  Holmes Lusher Canter started injections for  Dupixent  300 mg/73mL.  Loading dose of 600 mg given today. Given in left arm and right outer thigh. Waited 15 minutes with no issues.  Frequency: Every 2 weeks Epi-Pen:Epi-Pen Available  Consent signed and patient instructions given. Patient plans to continue injections at home.  NDC 9147-8295-62 Lot [id number] Expires 12/2025  Brock Canner 12/27/2023, 11:56 AM

## 2023-12-27 NOTE — Telephone Encounter (Signed)
 Per Dr. Tempie Fee referrals need to be placed for a rheumatologist for Joint aches and pains and recurring rash and cardiology for heart palpitations. These specialists need to be different from previous hemologist and cardiologist. Dr. Ronell Coe and Dr Barnett Booty. She prefers female providers.

## 2023-12-27 NOTE — Patient Instructions (Addendum)
 Recurrent Rash Patient has a history of recurrent rash, previously treated with antifungals, steroids, and wound care. The rash has been present since July and has been resistant to treatment. The rash is located on various parts of the body including the hips, chest, and behind the ear. The patient has been on Valacyclovir and Gabapentin, but the cause of the rash is still unclear. -Referral previously made to dermatology for further evaluation and biopsy  -Immune function labs showed strep pneumonia titers with a 30% protective titer rate which is low for an adult.  Would recommend she have a booster vaccine of strep pneumonia.  Would prefer Pneumovax in order to be able to repeat titers afterwards and see if she has mounted an appropriate response.   Schedule shot appt to receive Pnuemovax.  Get titers done no sooner than 6 weeks after vaccination.  -Starting Dupixent  injections today.  Continue every 2 week 1 injection self-administered.  Use abdomen or thighs and rotate your sites.  -Obtaining serum IgE to select foods today -Consider patch testing for contact allergy evaluation.  Contact dermatitis (contact allergy) -Patch testing is the test of choice to evaluate for contact dermatitis.   Patches are placed on Monday in our [place] office with return to office on Wednesday and Friday of same week for readings.   Readings can be done in [place] office.  Once patches are in place to do not get them wet.  You can take antihistamines while patches are in place.   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 The patient has been managing symptoms with over-the-counter antihistamines. -Perform avoidance measures for dust mites, molds, grass pollen, tree pollen, weed pollen, rodents, cat dander, dog dander, cockroach.  -Stinging insect panel was positive to  white faced hornet, yellow hornet, honeybee, yellowjacket, paper wasp as well as fire   ant IgE.   Continue avoidance as much as possible and have access to Epipen   0.3mg  device in case of allergic reaction and follow emergency action plan.  -Recommend use of cetirizine 10mg  take 2-4 tabs daily -Use Ryaltris nasal spray 2 sprays each nostril twice a day for nasal congestion or drainage.  This is a combination spray with nasal steroid for congestion control and nasal antihistamine for drainage control.  With using nasal sprays point tip of bottle toward eye on same side nostril and lean head slightly forward for best technique.   -Provided/discussed allergy shots today and you can check with insurance carrier and if covered you can schedule for new start appointment for allergy shots when ready.  This is a 3-5 year therapy to reduce your reactivity to environmental allergens.   Asthma Patient has a history of asthma and has been on Montelukast, but stopped due to side effect profile. The patient has been using an Albuterol  inhaler as needed. -Have access to albuterol  inhaler 2 puffs every 4-6 hours as needed for cough/wheeze/shortness of breath/chest tightness.  May use 15-20 minutes prior to activity.   Monitor frequency of use.   -Use Symbicort  160mcg 2 puffs twice a day with spacer device during respiratory illnesses.   GERD Patient has a history of severe GERD and has been taking Famotidine. -Continue Omeprazole and Famotidine as needed.   The oral allergy syndrome (OAS) or pollen-food allergy syndrome (PFAS) is a relatively common form of food allergy, particularly in adults. It typically occurs in people who have pollen allergies when the immune system "sees" proteins on  the food that look like proteins on the pollen. This results in the allergy antibody (IgE) binding to the food instead of the pollen. Patients typically report itching and/or mild swelling of the mouth and throat immediately following ingestion of certain uncooked fruits (including nuts) or raw vegetables. Only a  very small number of affected individuals experience systemic allergic reactions, such as anaphylaxis which occurs with true food allergies.      Will place referrals for cardiology (second opinion) and rheumatology.  Follow-up in 3-4 months or sooner if needed

## 2024-01-02 ENCOUNTER — Ambulatory Visit

## 2024-01-03 NOTE — Telephone Encounter (Signed)
 Followed up and noticed that [place] and she is scheduled with Rheumatology for 12/17 at 10:20 am with Dr. Alvira Josephs.  Cardiology reached out to [facility] and she is requesting a tilt table test from Dr. Tempie Fee before she wants to see a cardiologist.

## 2024-01-12 ENCOUNTER — Ambulatory Visit

## 2024-01-26 ENCOUNTER — Other Ambulatory Visit: Payer: Self-pay | Admitting: Allergy

## 2024-01-26 ENCOUNTER — Encounter: Payer: Self-pay | Admitting: Allergy

## 2024-01-31 ENCOUNTER — Other Ambulatory Visit: Payer: Self-pay | Admitting: *Deleted

## 2024-01-31 MED ORDER — CROMOLYN SODIUM 100 MG/5ML PO CONC: 100.0000 mg | Freq: Three times a day (TID) | ORAL | 1 refills | Status: DC

## 2024-02-08 ENCOUNTER — Ambulatory Visit: Admitting: Cardiology

## 2024-02-09 LAB — IGE NUT PROF. W/COMPONENT RFLX

## 2024-02-10 ENCOUNTER — Ambulatory Visit: Payer: Self-pay | Admitting: Allergy

## 2024-02-10 LAB — PEANUT COMPONENTS
F352-IgE Ara h 8: <0.1 kU/L
F422-IgE Ara h 1: <0.1 kU/L
F423-IgE Ara h 2: 0.2 kU/L — AB
F424-IgE Ara h 3: <0.1 kU/L
F427-IgE Ara h 9: 0.23 kU/L — AB
F447-IgE Ara h 6: <0.1 kU/L

## 2024-02-10 LAB — IGE NUT PROF. W/COMPONENT RFLX
F017-IgE Hazelnut (Filbert): 0.19 kU/L — AB
F018-IgE Brazil Nut: <0.1 kU/L
F202-IgE Cashew Nut: 0.27 kU/L — AB
F202-IgE Cashew Nut: <0.1 kU/L
F256-IgE Walnut: 0.2 kU/L — AB
Jug R 3 IgE: 0.33 kU/L — AB
Macadamia Nut, IgE: 0.18 kU/L — AB
Peanut, IgE: 0.42 kU/L — AB
Pecan Nut IgE: <0.1 kU/L

## 2024-02-10 LAB — PANEL 604721
Jug R 1 IgE: <0.1 kU/L
Jug R 3 IgE: <0.1 kU/L

## 2024-02-10 LAB — ALLERGEN, ONION, F48: Allergen Onion IgE: 0.37 kU/L — AB

## 2024-02-10 LAB — ALLERGEN AVOCADO F96: F096-IgE Avocado: <0.1 kU/L

## 2024-02-10 LAB — PANEL 604726
Cor A 1 IgE: <0.1 kU/L
Cor A 14 IgE: <0.1 kU/L
Cor A 8 IgE: 0.2 kU/L — AB
Cor A 9 IgE: <0.1 kU/L

## 2024-02-10 LAB — ALLERGEN COMPONENT COMMENTS

## 2024-02-10 LAB — ALLERGEN, CUCUMBER, F244: Allergen Cucumber IgE: 0.12 kU/L — AB

## 2024-02-10 LAB — ALLERGEN BANANA: Allergen Banana IgE: 0.88 kU/L — AB

## 2024-02-10 LAB — ALLERGEN, TOMATO F25: Allergen Tomato, IgE: 0.81 kU/L — AB

## 2024-02-21 ENCOUNTER — Encounter: Payer: Self-pay | Admitting: Allergy

## 2024-02-21 ENCOUNTER — Ambulatory Visit: Admitting: Allergy

## 2024-02-21 VITALS — BP 118/66 | HR 56 | Resp 16

## 2024-02-21 DIAGNOSIS — J309 Allergic rhinitis, unspecified: Secondary | ICD-10-CM

## 2024-02-21 DIAGNOSIS — R21 Rash and other nonspecific skin eruption: Secondary | ICD-10-CM | POA: Diagnosis not present

## 2024-02-21 DIAGNOSIS — K219 Gastro-esophageal reflux disease without esophagitis: Secondary | ICD-10-CM

## 2024-02-21 DIAGNOSIS — D8940 Mast cell activation, unspecified: Secondary | ICD-10-CM | POA: Diagnosis not present

## 2024-02-21 DIAGNOSIS — T781XXD Other adverse food reactions, not elsewhere classified, subsequent encounter: Secondary | ICD-10-CM | POA: Diagnosis not present

## 2024-02-21 DIAGNOSIS — W57XXXA Bitten or stung by nonvenomous insect and other nonvenomous arthropods, initial encounter: Secondary | ICD-10-CM

## 2024-02-21 DIAGNOSIS — H1010 Acute atopic conjunctivitis, unspecified eye: Secondary | ICD-10-CM

## 2024-02-21 DIAGNOSIS — J4520 Mild intermittent asthma, uncomplicated: Secondary | ICD-10-CM

## 2024-02-21 MED ORDER — CROMOLYN SODIUM 100 MG/5ML PO CONC: 200.0000 mg | Freq: Three times a day (TID) | ORAL | 1 refills | Status: DC

## 2024-02-21 NOTE — Progress Notes (Signed)
 Follow-up Note  RE: Ann Christensen Ann Christensen Ann Christensen Ann Christensen   History of present illness: Ann Christensen is a 38 y.o. female presenting today for follow-up of MCAS, recurrent rash, allergic rhinitis with conjunctivitis, asthma, GERD.  She was in the office on 12/27/2023 by myself. Discussed the use of AI scribe software for clinical note transcription with the patient, who gave verbal consent to proceed.  She has experienced severe chronic gastric distress for thirty years, with daily vomiting of bile for the past fifteen years. High bile pressure results in diarrhea. Since starting cromolyn  sodium (Gastrocrom ) in July, her gastric pain has significantly reduced, and she feels hunger for the first time in fifteen years. She takes 100 mg three times a day, 30 minutes before meals, and has experimented with doubling the dose for better results, especially during extremely recent triggering activities like horse shows she has this past weekend.  She is reactive to various foods and environmental factors, leading to avoidance of certain foods and environments. She is allergic to citric acid, found in many electrolyte drinks. She experiences joint pain and swelling after consuming certain foods and recently recalls eating a [employer] sandwich, which she attributes to peanut  oil.  She states [place] food today causes the same symptoms likely due to the MSG.  She is on Dupixent  every 2-week injections with the assistance of her wife, reporting significant improvement in symptoms, including reduced inflammation. She has lost weight since starting Dupixent , attributing it to reduced inflammation rather than dietary changes.  She state she can squeeze the muscle in her arms now without it feeling rock hard.    She states her anxiety and nightmares (occasional occurrence) have improved since starting these treatments.  She has a history of being  misdiagnosed and sent to gastroenterologists without resolution of her symptoms. She has researched her condition extensively and found that many of her lifelong symptoms align with MCAS which her symptoms are consistent with this diagnosis.  Review of systems: 10pt ROS negative unless noted above in HPI   Past medical/social/surgical/family history have been reviewed and are unchanged unless specifically indicated below.  No changes  Medication List: Current Outpatient Medications  Medication Sig Dispense Refill   albuterol  (VENTOLIN  HFA) 108 (90 Base) MCG/ACT inhaler USE 2 INHALATIONS EVERY 4 TO 6 HOURS AS NEEDED FOR WHEEZE OR SHORTNESS OF BREATH 25.5 g 3   Amphet-Dextroamphet 3-Bead ER 37.5 MG CP24 Take 1 capsule by mouth every morning.     budesonide -formoterol  (SYMBICORT ) 160-4.5 MCG/ACT inhaler Inhale 2 puffs into the lungs 2 (two) times daily. Use with spacer device 10.2 g 5   buPROPion  (WELLBUTRIN  XL) 150 MG 24 hr tablet Take 150 mg by mouth daily.     CHARLOTTE 24 FE 1-20 MG-MCG(24) CHEW Chew 1 tablet by mouth daily.     cromolyn  (GASTROCROM ) 100 MG/5ML solution Take 5 mLs (100 mg total) by mouth 3 (three) times daily before meals. 480 mL 1   cyclobenzaprine  (FLEXERIL ) 10 MG tablet Take 10 mg by mouth at bedtime.     dupilumab  (DUPIXENT ) 300 MG/2ML prefilled syringe Inject 600mg  subcutaneous once then 300mg  every 14 days 4 mL 11   EPINEPHrine  0.3 mg/0.3 mL IJ SOAJ injection Inject 0.3 mg into the muscle as needed for anaphylaxis. As needed for life-threatening allergic reactions 2 each 1   omeprazole (PRILOSEC) 40 MG capsule Take 40 mg by mouth daily.     Spacer/Aero-Holding Raguel FRENCH Use as  directed with inhaler 1 each 0   triamcinolone ointment (KENALOG) 0.5 % Apply topically 2 (two) times daily.     valACYclovir (VALTREX) 1000 MG tablet Take 1,000 mg by mouth daily.     No current facility-administered medications for this visit.     Known medication allergies: Allergies   Allergen Reactions   Bee Venom    Latex Rash    Adhesives      Physical examination: Blood pressure 118/66, pulse (!) 56, resp. rate 16, SpO2 98%.  General: Alert, interactive, in no acute distress. HEENT: PERRLA, TMs pearly gray, turbinates non-edematous without discharge, post-pharynx non erythematous. Neck: Supple without lymphadenopathy. Lungs: Clear to auscultation without wheezing, rhonchi or rales. {no increased work of breathing. CV: Normal S1, S2 without murmurs. Abdomen: Nondistended, nontender. Skin: Warm and dry, without lesions or rashes. Extremities:  No clubbing, cyanosis or edema. Neuro:   Grossly intact.  Diagnostics/Labs: Labs:  Component     Latest Ref Rng 02/06/2024  F017-IgE Hazelnut ([place])     Class 0/I kU/L 0.19 !   F256-IgE Walnut     Class 0/I kU/L 0.20 !   F202-IgE Cashew Nut     Class 0 kU/L <0.10   F018-IgE [place] Nut     Class 0 kU/L <0.10   Peanut , IgE     Class I kU/L 0.42 !   Macadamia Nut, IgE     Class 0/I kU/L 0.18 !   Pecan Nut IgE     Class 0 kU/L <0.10   F203-IgE Pistachio Nut     Class I kU/L 0.33 !   F020-IgE Almond     Class 0/I kU/L 0.27 !   F422-IgE Ara h 1     Class 0 kU/L <0.10   F423-IgE Ara h 2     Class 0/I kU/L 0.20 !   F424-IgE Ara h 3     Class 0 kU/L <0.10   F447-IgE Ara h 6     Class 0 kU/L <0.10   F352-IgE Ara h 8     Class 0 kU/L <0.10   F427-IgE Ara h 9     Class 0/I kU/L 0.23 !   Cor A 1 IgE     Class 0 kU/L <0.10   Cor A 8 IgE     Class 0/I kU/L 0.20 !   Cor A 9 IgE     Class 0 kU/L <0.10   Cor A 14 IgE     Class 0 kU/L <0.10   Jug R 1 IgE     Class 0 kU/L <0.10   Jug R 3 IgE     Class 0 kU/L <0.10   F096-IgE Avocado     Class 0 kU/L <0.10   Allergen Tomato, IgE     Class II kU/L 0.81 !   Allergen Onion IgE     Class I kU/L 0.37 !   Allergen Cucumber IgE     Class 0/I kU/L 0.12 !   Allergen Banana IgE     Class II kU/L 0.88 !   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  Would  recommend she have a booster vaccine of strep pneumonia.  Would prefer Pneumovax in order to be able to repeat titers afterwards and see if she has mounted an appropriate response.   Schedule shot appt to receive Pnuemovax.  Get titers done no sooner than 6 weeks after vaccination.  -Continue Dupixent  injections every 2 week 1 injection at home administration.  Use abdomen or thighs and rotate your sites.  -Consider patch testing for contact allergy  evaluation.  Contact dermatitis (contact allergy ) Patch testing is the test of choice to evaluate for contact dermatitis.   Patches are placed on Monday in our [place] office with return to office on Wednesday and Friday of same week for readings.   Readings can be done in [place] office.  Once patches are in place to do not get them wet.  You can take antihistamines while patches are in place.  -Continue avoidance measures for dust mites, molds, grass pollen, tree pollen, weed pollen, rodents, cat dander, dog dander, cockroach.  -Stinging insect panel was positive to  white faced hornet, yellow hornet, honeybee, yellowjacket, paper wasp as well as fire  ant IgE.   Continue avoidance as much as possible and have access to Epipen   0.3mg  device in case of allergic reaction and follow emergency action plan.  -Recommend use of cetirizine 10mg  take 2-4 tabs daily -Use Ryaltris nasal spray 2 sprays each nostril twice a day for nasal congestion or drainage.  This is a combination spray with nasal steroid for congestion control and nasal antihistamine for drainage control.  With using nasal sprays point tip of bottle toward  eye on same side nostril and lean head slightly forward for best technique.   - Consider allergy  shots as a means of long-term control.  Allergy  shots re-train and reset the immune system to ignore environmental allergens and decrease the resulting immune response to those allergens (sneezing, itchy watery eyes, runny nose, nasal congestion, etc).   Allergy  shots improve symptoms in 75-85% of patients.  -Continue Gastrocrom  increased to 200mg  prior to meals and if needed can take additional 4th dose for symptoms -Have access to albuterol  inhaler 2 puffs every 4-6 hours as needed for cough/wheeze/shortness of breath/chest tightness.  May use 15-20 minutes prior to activity.   Monitor frequency of use.   -Use Symbicort  160mcg 2 puffs twice a day with spacer device during respiratory illnesses.  -Continue Omeprazole and Famotidine as needed.  Follow-up in 3-4 months or sooner if needed  I appreciate the opportunity to take part in Pecolia's care. Please do not hesitate to contact me with questions.  Sincerely,   Danita Brain, MD Allergy /Immunology [facility]

## 2024-02-21 NOTE — Patient Instructions (Addendum)
 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  Would recommend she have a booster vaccine of strep pneumonia.  Would prefer Pneumovax in order to be able to repeat titers afterwards and see if she has mounted an appropriate response.   Schedule shot appt to receive Pnuemovax.  Get titers done no sooner than 6 weeks after vaccination.  -Continue Dupixent  injections every 2 week 1 injection at home administration.  Use abdomen or thighs and rotate your sites.  -Consider patch testing for contact allergy  evaluation.  Contact dermatitis (contact allergy ) Patch testing is the test of choice to evaluate for contact dermatitis.   Patches are placed on Monday in our [place] office with return to office on Wednesday and Friday of same week for readings.   Readings can be done in [place] office.  Once patches are in place to do not get them wet.  You can take antihistamines while patches are in place.  -Continue avoidance measures for dust mites, molds, grass pollen, tree pollen, weed pollen, rodents, cat dander, dog dander, cockroach.  -Stinging insect panel was positive to  white faced hornet, yellow hornet, honeybee, yellowjacket, paper wasp as well as fire  ant IgE.   Continue avoidance as much as possible and have access to Epipen   0.3mg  device in case of allergic reaction and follow emergency action plan.  -Recommend use of cetirizine 10mg  take 2-4 tabs daily -Use Ryaltris nasal spray 2 sprays each nostril twice a day for nasal congestion or drainage.  This is a combination spray with nasal steroid for congestion control and nasal antihistamine for drainage control.  With using nasal sprays point tip of bottle toward  eye on same side nostril and lean head slightly forward for best technique.   - Consider allergy  shots as a means of long-term control.  Allergy  shots re-train and reset the immune system to ignore environmental allergens and decrease the resulting immune response to those allergens (sneezing, itchy watery eyes, runny nose, nasal congestion, etc).   Allergy  shots improve symptoms in 75-85% of patients.  -Continue Gastrocrom  increased to 200mg  prior to meals and if needed can take additional 4th dose for symptoms -Have access to albuterol  inhaler 2 puffs every 4-6 hours as needed for cough/wheeze/shortness of breath/chest tightness.  May use 15-20 minutes prior to activity.   Monitor frequency of use.   -Use Symbicort  160mcg 2 puffs twice a day with spacer device during respiratory illnesses.  -Continue Omeprazole and Famotidine as needed.   The oral allergy  syndrome (OAS) or pollen-food allergy  syndrome (PFAS) is a relatively common form of food allergy , particularly in adults. It typically occurs in people who have pollen allergies when the immune system sees proteins on the food that look like proteins on the pollen. This results in the allergy  antibody (IgE) binding to the food instead of the pollen. Patients typically report itching and/or mild swelling of the mouth and throat immediately following ingestion of certain uncooked fruits (including nuts) or raw vegetables. Only a very small number of affected individuals experience systemic allergic reactions, such as anaphylaxis which occurs with true food allergies.       Follow-up in 3-4 months or sooner if needed

## 2024-03-02 ENCOUNTER — Encounter: Payer: Self-pay | Admitting: Allergy

## 2024-03-16 NOTE — Progress Notes (Signed)
 " Cardiology Office Note   Date:  03/20/2024  ID:  Ann, Christensen [birth year], MRN [id number] PCP: Cleotilde, Virginia  E, PA  [facility] Providers Cardiologist:  Redell Leiter, MD     History of Present Illness Ann Christensen is a 38 y.o. female with a past medical history of attention deficit disorder, chronic pain, palpitations, MCAS.  08/25/2023 echo EF 60-65%, no valvular abnormalities.  ~2025 monitor average heart rate of 71 bpm, rare VE less than 1% burden, SVE burden rare at less than 1%, 2 short runs of SVT, 41 patient triggers associated with sinus rhythm  She established care with Dr. Liborio on 08/08/2023 at the best of her PCP for evaluation of palpitations.  A monitor had been arranged by her PCP which revealed an average heart rate of 71 bpm, rare VE less than 1% burden, SVE burden rare at less than 1%, 2 short runs of SVT, 41 patient triggers associated with sinus rhythm.  Her palpitations were more bothersome for her at night when resting, an echocardiogram was arranged revealing an EF of 60 to 65%, no valvular abnormalities.  She was advised she could follow-up on a as needed basis.  In the interim, she has been followed by allergy  and asthma specialist, diagnosed with MCAS, is on Dupixent  as well as cromolyn , with slight improvement in her symptoms.  She presents today for follow up of her palpitations. She has been diagnosed with MCAS. 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 Her symptoms have been present for many years however, have been intolerable since she suffered a bee sting last summer and subsequently had a viral process and her symptoms were exacerbated. She has presyncopal sensations frequently, but has not fully syncopized. She has periods where her heart races and when it drops low.  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  She is  drinking water but is not staying adequately hydrated.  She does wear compression garments when able but again sometimes the heat prevents this. She denies chest pain,dyspnea, pnd, orthopnea, n, v, syncope, weight gain, or early satiety.    ROS: Review of Systems  Constitutional:  Positive for malaise/fatigue.  Cardiovascular:  Positive for palpitations.  Neurological:  Positive for dizziness and loss of consciousness (near syncope).     Studies Reviewed EKG Interpretation Date/Time:  Tuesday March 20 2024 13:36:36 EDT Ventricular Rate:  61 PR Interval:  144 QRS Duration:  66 QT Interval:  406 QTC Calculation: 408 R Axis:   74  Text Interpretation: Normal sinus rhythm Normal ECG When compared with ECG of 08-Aug-2023 09:41, No significant change was found Confirmed by Carlin Nest [phone]) on 03/20/2024 1:40:07 PM    Cardiac Studies & Procedures   ______________________________________________________________________________________________     ECHOCARDIOGRAM  ECHOCARDIOGRAM COMPLETE 08/25/2023  Narrative ECHOCARDIOGRAM REPORT    Patient Name:   Ann Christensen Date of Exam: 08/25/2023 Medical Rec #:  [id number]          Height:       63.0 in Accession #:    [id number]         Weight:       181.6 lb Date of Birth:  10-Feb-1986         BSA:          1.856 m Patient Age:    89 years  BP:           118/80 mmHg Patient Gender: F                  HR:           62 bpm. Exam Location:  [facility]  Procedure: 2D Echo, Cardiac Doppler, Color Doppler, Strain Analysis and 3D Echo  Indications:    Palpitations [R00.2 (ICD-10-CM)]  History:        Patient has no prior history of Echocardiogram examinations. Abnormal ECG.  Sonographer:    Charlie Jointer RDCS Referring Phys: [id number] ALEAN SAUNDERS MADIREDDY  IMPRESSIONS   1. Left ventricular ejection fraction, by estimation, is 60 to 65%. Left ventricular ejection fraction by 3D volume is 68 %. The left ventricle has  normal function. The left ventricle has no regional wall motion abnormalities. Left ventricular diastolic parameters were normal. The average left ventricular global longitudinal strain is 23.1 %. The global longitudinal strain is normal. 2. Right ventricular systolic function is normal. The right ventricular size is normal. There is normal pulmonary artery systolic pressure. 3. The mitral valve is normal in structure. No evidence of mitral valve regurgitation. No evidence of mitral stenosis. 4. The aortic valve is tricuspid. Aortic valve regurgitation is not visualized. No aortic stenosis is present. 5. The inferior vena cava is normal in size with greater than 50% respiratory variability, suggesting right atrial pressure of 3 mmHg.  FINDINGS Left Ventricle: Left ventricular ejection fraction, by estimation, is 60 to 65%. Left ventricular ejection fraction by 3D volume is 68 %. The left ventricle has normal function. The left ventricle has no regional wall motion abnormalities. The average left ventricular global longitudinal strain is 23.1 %. The global longitudinal strain is normal. The left ventricular internal cavity size was normal in size. There is no left ventricular hypertrophy. Left ventricular diastolic parameters were normal. Normal left ventricular filling pressure.  Right Ventricle: The right ventricular size is normal. No increase in right ventricular wall thickness. Right ventricular systolic function is normal. There is normal pulmonary artery systolic pressure. The tricuspid regurgitant velocity is 2.37 m/s, and with an assumed right atrial pressure of 3 mmHg, the estimated right ventricular systolic pressure is 25.5 mmHg.  Left Atrium: Left atrial size was normal in size.  Right Atrium: Right atrial size was normal in size.  Pericardium: There is no evidence of pericardial effusion.  Mitral Valve: The mitral valve is normal in structure. No evidence of mitral valve  regurgitation. No evidence of mitral valve stenosis.  Tricuspid Valve: The tricuspid valve is normal in structure. Tricuspid valve regurgitation is trivial. No evidence of tricuspid stenosis.  Aortic Valve: The aortic valve is tricuspid. Aortic valve regurgitation is not visualized. No aortic stenosis is present.  Pulmonic Valve: The pulmonic valve was normal in structure. Pulmonic valve regurgitation is not visualized. No evidence of pulmonic stenosis.  Aorta: The aortic root, ascending aorta, aortic arch and descending aorta are all structurally normal, with no evidence of dilitation or obstruction.  Venous: A normal flow pattern is recorded from the right upper pulmonary vein. The inferior vena cava is normal in size with greater than 50% respiratory variability, suggesting right atrial pressure of 3 mmHg.  IAS/Shunts: No atrial level shunt detected by color flow Doppler.   LEFT VENTRICLE PLAX 2D LVIDd:         4.70 cm         Diastology LVIDs:         2.60 cm  LV e' medial:    11.88 cm/s LV PW:         0.90 cm         LV E/e' medial:  8.9 LV IVS:        0.90 cm         LV e' lateral:   16.53 cm/s LVOT diam:     2.00 cm         LV E/e' lateral: 6.4 LV SV:         83 LV SV Index:   45              2D LVOT Area:     3.14 cm        Longitudinal Strain 2D Strain GLS  23.1 % Avg:  3D Volume EF LV 3D EF:    Left ventricul ar ejection fraction by 3D volume is 68 %.  3D Volume EF: 3D EF:        68 % LV EDV:       123 ml LV ESV:       39 ml LV SV:        84 ml  RIGHT VENTRICLE             IVC RV Basal diam:  3.30 cm     IVC diam: 1.70 cm RV Mid diam:    3.00 cm RV S prime:     13.60 cm/s TAPSE (M-mode): 2.6 cm  LEFT ATRIUM             Index        RIGHT ATRIUM           Index LA diam:        3.10 cm 1.67 cm/m   RA Area:     14.80 cm LA Vol (A2C):   42.7 ml 23.01 ml/m  RA Volume:   39.50 ml  21.28 ml/m LA Vol (A4C):   38.2 ml 20.58 ml/m LA Biplane Vol:  43.4 ml 23.38 ml/m AORTIC VALVE LVOT Vmax:   127.67 cm/s LVOT Vmean:  83.067 cm/s LVOT VTI:    0.263 m  AORTA Ao Root diam: 3.00 cm Ao Asc diam:  3.50 cm Ao Desc diam: 2.10 cm  MITRAL VALVE                TRICUSPID VALVE MV Area (PHT): 3.59 cm     TR Peak grad:   22.5 mmHg MV Decel Time: 212 msec     TR Vmax:        237.00 cm/s MV E velocity: 106.00 cm/s MV A velocity: 73.15 cm/s   SHUNTS MV E/A ratio:  1.45         Systemic VTI:  0.26 m Systemic Diam: 2.00 cm  Redell Leiter MD Electronically signed by Redell Leiter MD Signature Date/Time: 08/25/2023/12:14:15 PM    Final    MONITORS  LONG TERM MONITOR (3-14 DAYS) 07/01/2023       ______________________________________________________________________________________________      Risk Assessment/Calculations           Physical Exam VS:  BP 102/62   Pulse 61   Ht 5' 3 (1.6 m)   Wt 180 lb 9.6 oz (81.9 kg)   SpO2 99%   BMI 31.99 kg/m        Wt Readings from Last 3 Encounters:  03/20/24 180 lb 9.6 oz (81.9 kg)  08/08/23 181 lb 9.6 oz (82.4 kg)  07/26/23 178 lb (80.7 kg)  GEN: Well nourished, well developed in no acute distress NECK: No JVD; No carotid bruits CARDIAC: RRR, no murmurs, rubs, gallops RESPIRATORY:  Clear to auscultation without rales, wheezing or rhonchi  ABDOMEN: Soft, non-tender, non-distended EXTREMITIES:  No edema; No deformity   ASSESSMENT AND PLAN Dysautonomia-her symptoms are consistent with dysautonomia, we will try conservative measures first.  Increase oral hydration, and salt.  We do not perform tilt table testing, would have to refer her out for this, but her symptoms are most consistent with dysautonomia. She will send me a MyChart message in a few weeks to let me know how she is feeling after this. She will look up midodrine and florinef to see if she is willing/interested in starting either if her symptoms are not controlled with conservative measures. She has many of the clinical  feature for EDS as well.   MCAS - followed by Dr. Jeneal with Allergy  and Asthma specialist, on cromolyn  sodium, zyrtec and famotidine.   Palpitations - previous monitor revealed episodes of SVT, she will try to capture EKG tracings on her smart watch when they occur.          Dispo: Increase water intake, increase salt intake, send MyChart message in 3 weeks with symptoms, follow up with Dr. Monetta in 8 weeks.   Signed, Delon JAYSON Hoover, NP  "

## 2024-03-20 ENCOUNTER — Ambulatory Visit: Attending: Cardiology | Admitting: Cardiology

## 2024-03-20 ENCOUNTER — Encounter: Payer: Self-pay | Admitting: Cardiology

## 2024-03-20 VITALS — BP 102/62 | HR 61 | Ht 63.0 in | Wt 180.6 lb

## 2024-03-20 DIAGNOSIS — D8940 Mast cell activation, unspecified: Secondary | ICD-10-CM | POA: Diagnosis not present

## 2024-03-20 DIAGNOSIS — G901 Familial dysautonomia [Riley-Day]: Secondary | ICD-10-CM

## 2024-03-20 DIAGNOSIS — R002 Palpitations: Secondary | ICD-10-CM | POA: Diagnosis not present

## 2024-03-20 NOTE — Patient Instructions (Addendum)
 Medication Instructions:  Your physician recommends that you continue on your current medications as directed. Please refer to the Current Medication list given to you today.  *If you need a refill on your cardiac medications before your next appointment, please call your pharmacy*  Lab Work: NONE If you have labs (blood work) drawn today and your tests are completely normal, you will receive your results only by: MyChart Message (if you have MyChart) OR A paper copy in the mail If you have any lab test that is abnormal or we need to change your treatment, we will call you to review the results.  Testing/Procedures: NONE  Follow-Up: At [facility], you and your health needs are our priority.  As part of our continuing mission to provide you with exceptional heart care, our providers are all part of one team.  This team includes your primary Cardiologist (physician) and Advanced Practice Providers or APPs (Physician Assistants and Nurse Practitioners) who all work together to provide you with the care you need, when you need it.  Your next appointment:   8 week(s)  Provider:   Redell Leiter, MD    We recommend signing up for the patient portal called MyChart.  Sign up information is provided on this After Visit Summary.  MyChart is used to connect with patients for Virtual Visits (Telemedicine).  Patients are able to view lab/test results, encounter notes, upcoming appointments, etc.  Non-urgent messages can be sent to your provider as well.   To learn more about what you can do with MyChart, go to [email or web address].   Other Instructions Increase water intake to 100 oz per day Increase salt intake to 3-4 grams per day Look up Midodrine and Florinef for symptoms Send message through MyChart in 3 weeks to let Ann Christensen know how you are feeling

## 2024-03-21 ENCOUNTER — Other Ambulatory Visit: Payer: Self-pay

## 2024-03-21 MED ORDER — CROMOLYN SODIUM 100 MG/5ML PO CONC: 200.0000 mg | Freq: Three times a day (TID) | ORAL | 3 refills | Status: DC

## 2024-03-30 ENCOUNTER — Other Ambulatory Visit: Payer: Self-pay | Admitting: *Deleted

## 2024-03-30 ENCOUNTER — Telehealth: Payer: Self-pay | Admitting: Allergy

## 2024-03-30 MED ORDER — CROMOLYN SODIUM 100 MG/5ML PO CONC: 200.0000 mg | Freq: Three times a day (TID) | ORAL | 3 refills | Status: DC

## 2024-03-30 MED ORDER — CROMOLYN SODIUM 100 MG/5ML PO CONC: 200.0000 mg | Freq: Three times a day (TID) | ORAL | 1 refills | Status: DC

## 2024-03-30 NOTE — Telephone Encounter (Signed)
RX sent and patient informed.  

## 2024-03-30 NOTE — Telephone Encounter (Signed)
 Patient is needing a 90 day supply of Cromolyn  sent to [facility] on [address]. She requested a call once this is sent in.

## 2024-03-31 ENCOUNTER — Other Ambulatory Visit: Payer: Self-pay | Admitting: Allergy

## 2024-05-14 DIAGNOSIS — Z79899 Other long term (current) drug therapy: Secondary | ICD-10-CM | POA: Insufficient documentation

## 2024-05-14 NOTE — Progress Notes (Unsigned)
 Cardiology Office Note:    Date:  05/15/2024   ID:  Ann Christensen, DOB 1986/03/08, MRN [id number]  PCP:  Cleotilde, Virginia  E, PA  Cardiologist:  Redell Leiter, MD    Referring MD: Cleotilde, Virginia  E, PA    ASSESSMENT:    1. Palpitations   2. Neuropathy   3. Mast cell activation syndrome   4. Attention deficit hyperactivity disorder (ADHD), unspecified ADHD type    PLAN:    In order of problems listed above:  Ongoing symptoms smart watch is present can capture and send events to me at this time I do not think she requires a beta-blocker or consideration of antiarrhythmic drug especially with structurally normal heart other Complex issues include mast cell activation syndrome being managed by allergy  with improvement in the quality of her life ongoing neuropathic symptoms referral to neurology and scheduled rheumatology evaluation   Next appointment: I will see back in the future as needed   Medication Adjustments/Labs and Tests Ordered: Current medicines are reviewed at length with the patient today.  Concerns regarding medicines are outlined above.  Orders Placed This Encounter  Procedures   Ambulatory referral to Neurology   Ambulatory referral to [facility]   No orders of the defined types were placed in this encounter.    History of Present Illness:    Ann Christensen is a 38 y.o. female with a hx of palpitation and lightheadedness last seen by Delon Hoover nurse practitioner 03/20/2024 her problems include ADDH palpitation she was seen initially Dr ROSALIN she wore a monitor that was unremarkable and echocardiogram she has been seen by allergy  and diagnosed with mast cell activation syndrome and is on Dupixent  and at that visit with Delon Hoover her complaint was episodes of lightheadedness and feeling as if she would faint conservative measures were advised including but intake salt and there is a discussion whether or not she would benefit.  She had an  echocardiogram done January left ventricle normal in size wall thickness EF GLS and diastolic function.  Right ventricle normal size and function and no significant valvular abnormality.  Compliance with diet, lifestyle and medications: Yes  She only takes Flexeril  on a rare occasion no longer takes ADDH medication. I think my role today was an overview I again reviewed the fact that she has a structurally normal heart and in terms of frequency of arrhythmia a normal event monitor.  She continues to have nocturnal palpitation and I asked her to capture it with her smart watch and she can send me episodes for MyChart.  At this time I do not think she would benefit from a beta-blocker or medications for hypotension she had an orthostatic shift in my office and is not tachycardic. She has an upcoming appointment with rheumatology I think is important She is complaining of neuropathic pain and request to see a female neurologist. Past Medical History:  Diagnosis Date   Acne vulgaris 05/17/2019   ADHD (attention deficit hyperactivity disorder) 09/09/2016   Anxiety    Anxiety disorder    Asthma    Chronic pain syndrome    Controlled substance agreement signed 01/03/2018   Depression    Depression, recurrent 09/09/2016   Diaphragmatic hernia 09/28/2007   Qualifier: Diagnosis of   By: Vernell LPN, Barnie SCULL SNOMED Dx Update Oct 2024     Dizziness    Environmental allergies    GERD 09/28/2007   Qualifier: Diagnosis of   By: Vernell  LPN, Deborah         Health care maintenance 12/08/2016   Hormone disorder    Insomnia 09/09/2016   IRRITABLE BOWEL SYNDROME 09/28/2007   Qualifier: Diagnosis of   By: Vernell LPN, Deborah         Multiple food allergies 07/05/2018   Other fatigue    PAC (premature atrial contraction)    Palpitations    Panic disorder    PCOS (polycystic ovarian syndrome)    Pharyngoesophageal dysphagia    PTSD (post-traumatic stress disorder)    Scoliosis    Skin rash      Current Medications: Current Meds  Medication Sig   albuterol  (VENTOLIN  HFA) 108 (90 Base) MCG/ACT inhaler USE 2 INHALATIONS EVERY 4 TO 6 HOURS AS NEEDED FOR WHEEZE OR SHORTNESS OF BREATH   budesonide -formoterol  (SYMBICORT ) 160-4.5 MCG/ACT inhaler Inhale 2 puffs into the lungs 2 (two) times daily. Use with spacer device   buPROPion  (WELLBUTRIN  XL) 150 MG 24 hr tablet Take 150 mg by mouth daily.   cetirizine (ZYRTEC) 10 MG tablet Take 10 mg by mouth 4 (four) times daily.   CHARLOTTE 24 FE 1-20 MG-MCG(24) CHEW Chew 1 tablet by mouth daily.   cromolyn  (GASTROCROM ) 100 MG/5ML solution Take 10 mLs (200 mg total) by mouth 3 (three) times daily before meals. Disregard last rx, requested 90 day   cyclobenzaprine  (FLEXERIL ) 10 MG tablet Take 10 mg by mouth as needed for muscle spasms.   dupilumab  (DUPIXENT ) 300 MG/2ML prefilled syringe Inject 600mg  subcutaneous once then 300mg  every 14 days   EPINEPHrine  0.3 mg/0.3 mL IJ SOAJ injection Inject 0.3 mg into the muscle as needed for anaphylaxis. As needed for life-threatening allergic reactions   famotidine (PEPCID) 20 MG tablet Take 20 mg by mouth 2 (two) times daily.   Spacer/Aero-Holding Raguel FRENCH Use as directed with inhaler   valACYclovir (VALTREX) 1000 MG tablet Take 1,000 mg by mouth as needed (cold sores outbreak).   [DISCONTINUED] omeprazole (PRILOSEC) 40 MG capsule Take 40 mg by mouth daily.      EKGs/Labs/Other Studies Reviewed:    The following studies were reviewed today:  Cardiac Studies & Procedures   ______________________________________________________________________________________________     ECHOCARDIOGRAM  ECHOCARDIOGRAM COMPLETE 08/25/2023  Narrative ECHOCARDIOGRAM REPORT    Patient Name:   Ann Christensen Date of Exam: 08/25/2023 Medical Rec #:  [id number]          Height:       63.0 in Accession #:    [id number]         Weight:       181.6 lb Date of Birth:  02/08/1986         BSA:          1.856  m Patient Age:    37 years           BP:           118/80 mmHg Patient Gender: F                  HR:           62 bpm. Exam Location:  [facility]  Procedure: 2D Echo, Cardiac Doppler, Color Doppler, Strain Analysis and 3D Echo  Indications:    Palpitations [R00.2 (ICD-10-CM)]  History:        Patient has no prior history of Echocardiogram examinations. Abnormal ECG.  Sonographer:    Charlie Jointer RDCS Referring Phys: [id number] ALEAN SAUNDERS MADIREDDY  IMPRESSIONS   1. Left ventricular  ejection fraction, by estimation, is 60 to 65%. Left ventricular ejection fraction by 3D volume is 68 %. The left ventricle has normal function. The left ventricle has no regional wall motion abnormalities. Left ventricular diastolic parameters were normal. The average left ventricular global longitudinal strain is 23.1 %. The global longitudinal strain is normal. 2. Right ventricular systolic function is normal. The right ventricular size is normal. There is normal pulmonary artery systolic pressure. 3. The mitral valve is normal in structure. No evidence of mitral valve regurgitation. No evidence of mitral stenosis. 4. The aortic valve is tricuspid. Aortic valve regurgitation is not visualized. No aortic stenosis is present. 5. The inferior vena cava is normal in size with greater than 50% respiratory variability, suggesting right atrial pressure of 3 mmHg.  FINDINGS Left Ventricle: Left ventricular ejection fraction, by estimation, is 60 to 65%. Left ventricular ejection fraction by 3D volume is 68 %. The left ventricle has normal function. The left ventricle has no regional wall motion abnormalities. The average left ventricular global longitudinal strain is 23.1 %. The global longitudinal strain is normal. The left ventricular internal cavity size was normal in size. There is no left ventricular hypertrophy. Left ventricular diastolic parameters were normal. Normal left ventricular filling  pressure.  Right Ventricle: The right ventricular size is normal. No increase in right ventricular wall thickness. Right ventricular systolic function is normal. There is normal pulmonary artery systolic pressure. The tricuspid regurgitant velocity is 2.37 m/s, and with an assumed right atrial pressure of 3 mmHg, the estimated right ventricular systolic pressure is 25.5 mmHg.  Left Atrium: Left atrial size was normal in size.  Right Atrium: Right atrial size was normal in size.  Pericardium: There is no evidence of pericardial effusion.  Mitral Valve: The mitral valve is normal in structure. No evidence of mitral valve regurgitation. No evidence of mitral valve stenosis.  Tricuspid Valve: The tricuspid valve is normal in structure. Tricuspid valve regurgitation is trivial. No evidence of tricuspid stenosis.  Aortic Valve: The aortic valve is tricuspid. Aortic valve regurgitation is not visualized. No aortic stenosis is present.  Pulmonic Valve: The pulmonic valve was normal in structure. Pulmonic valve regurgitation is not visualized. No evidence of pulmonic stenosis.  Aorta: The aortic root, ascending aorta, aortic arch and descending aorta are all structurally normal, with no evidence of dilitation or obstruction.  Venous: A normal flow pattern is recorded from the right upper pulmonary vein. The inferior vena cava is normal in size with greater than 50% respiratory variability, suggesting right atrial pressure of 3 mmHg.  IAS/Shunts: No atrial level shunt detected by color flow Doppler.   LEFT VENTRICLE PLAX 2D LVIDd:         4.70 cm         Diastology LVIDs:         2.60 cm         LV e' medial:    11.88 cm/s LV PW:         0.90 cm         LV E/e' medial:  8.9 LV IVS:        0.90 cm         LV e' lateral:   16.53 cm/s LVOT diam:     2.00 cm         LV E/e' lateral: 6.4 LV SV:         83 LV SV Index:   45  2D LVOT Area:     3.14 cm        Longitudinal Strain 2D  Strain GLS  23.1 % Avg:  3D Volume EF LV 3D EF:    Left ventricul ar ejection fraction by 3D volume is 68 %.  3D Volume EF: 3D EF:        68 % LV EDV:       123 ml LV ESV:       39 ml LV SV:        84 ml  RIGHT VENTRICLE             IVC RV Basal diam:  3.30 cm     IVC diam: 1.70 cm RV Mid diam:    3.00 cm RV S prime:     13.60 cm/s TAPSE (M-mode): 2.6 cm  LEFT ATRIUM             Index        RIGHT ATRIUM           Index LA diam:        3.10 cm 1.67 cm/m   RA Area:     14.80 cm LA Vol (A2C):   42.7 ml 23.01 ml/m  RA Volume:   39.50 ml  21.28 ml/m LA Vol (A4C):   38.2 ml 20.58 ml/m LA Biplane Vol: 43.4 ml 23.38 ml/m AORTIC VALVE LVOT Vmax:   127.67 cm/s LVOT Vmean:  83.067 cm/s LVOT VTI:    0.263 m  AORTA Ao Root diam: 3.00 cm Ao Asc diam:  3.50 cm Ao Desc diam: 2.10 cm  MITRAL VALVE                TRICUSPID VALVE MV Area (PHT): 3.59 cm     TR Peak grad:   22.5 mmHg MV Decel Time: 212 msec     TR Vmax:        237.00 cm/s MV E velocity: 106.00 cm/s MV A velocity: 73.15 cm/s   SHUNTS MV E/A ratio:  1.45         Systemic VTI:  0.26 m Systemic Diam: 2.00 cm  Redell Leiter MD Electronically signed by Redell Leiter MD Signature Date/Time: 08/25/2023/12:14:15 PM    Final    MONITORS  LONG TERM MONITOR (3-14 DAYS) 07/01/2023       ______________________________________________________________________________________________          Recent Labs: No results found for requested labs within last 365 days.  Recent Lipid Panel    Component Value Date/Time   CHOL 199 01/31/2020 1154   TRIG 65 01/31/2020 1154   HDL 80 01/31/2020 1154   CHOLHDL 2.5 01/31/2020 1154   LDLCALC 107 (H) 01/31/2020 1154    Physical Exam:    VS:  BP 132/81 (BP Location: Right Arm, Patient Position: Supine, Cuff Size: Normal)   Pulse 66   Ht 5' 3 (1.6 m)   Wt 187 lb (84.8 kg)   SpO2 98%   BMI 33.13 kg/m     Wt Readings from Last 3 Encounters:  05/15/24 187 lb  (84.8 kg)  03/20/24 180 lb 9.6 oz (81.9 kg)  08/08/23 181 lb 9.6 oz (82.4 kg)     GEN:  Well nourished, well developed in no acute distress HEENT: Normal NECK: No JVD; No carotid bruits LYMPHATICS: No lymphadenopathy CARDIAC: RRR, no murmurs, rubs, gallops RESPIRATORY:  Clear to auscultation without rales, wheezing or rhonchi  ABDOMEN: Soft, non-tender, non-distended MUSCULOSKELETAL:  No edema; No deformity  SKIN: Warm and dry NEUROLOGIC:  Alert and oriented x 3 PSYCHIATRIC:  Normal affect    Signed, Redell Leiter, MD  05/15/2024 12:02 PM    [facility]

## 2024-05-15 ENCOUNTER — Encounter: Payer: Self-pay | Admitting: Cardiology

## 2024-05-15 ENCOUNTER — Ambulatory Visit: Attending: Cardiology | Admitting: Cardiology

## 2024-05-15 VITALS — BP 132/81 | HR 66 | Ht 63.0 in | Wt 187.0 lb

## 2024-05-15 DIAGNOSIS — R002 Palpitations: Secondary | ICD-10-CM | POA: Diagnosis not present

## 2024-05-15 DIAGNOSIS — G629 Polyneuropathy, unspecified: Secondary | ICD-10-CM | POA: Diagnosis not present

## 2024-05-15 DIAGNOSIS — F909 Attention-deficit hyperactivity disorder, unspecified type: Secondary | ICD-10-CM

## 2024-05-15 DIAGNOSIS — D8940 Mast cell activation, unspecified: Secondary | ICD-10-CM | POA: Diagnosis not present

## 2024-05-15 NOTE — Patient Instructions (Signed)
 Medication Instructions:  Your physician recommends that you continue on your current medications as directed. Please refer to the Current Medication list given to you today.  *If you need a refill on your cardiac medications before your next appointment, please call your pharmacy*  Lab Work: None If you have labs (blood work) drawn today and your tests are completely normal, you will receive your results only by: MyChart Message (if you have MyChart) OR A paper copy in the mail If you have any lab test that is abnormal or we need to change your treatment, we will call you to review the results.  Testing/Procedures: None  Follow-Up: At [facility], you and your health needs are our priority.  As part of our continuing mission to provide you with exceptional heart care, our providers are all part of one team.  This team includes your primary Cardiologist (physician) and Advanced Practice Providers or APPs (Physician Assistants and Nurse Practitioners) who all work together to provide you with the care you need, when you need it.  Your next appointment:   Follow up as needed with [facility]  Provider:   Redell Leiter, MD    We recommend signing up for the patient portal called MyChart.  Sign up information is provided on this After Visit Summary.  MyChart is used to connect with patients for Virtual Visits (Telemedicine).  Patients are able to view lab/test results, encounter notes, upcoming appointments, etc.  Non-urgent messages can be sent to your provider as well.   To learn more about what you can do with MyChart, go to [email or web address].   Other Instructions None

## 2024-05-22 ENCOUNTER — Encounter: Payer: Self-pay | Admitting: *Deleted

## 2024-05-22 ENCOUNTER — Ambulatory Visit: Admitting: Allergy

## 2024-05-22 ENCOUNTER — Encounter: Payer: Self-pay | Admitting: Allergy

## 2024-05-22 ENCOUNTER — Telehealth: Payer: Self-pay | Admitting: *Deleted

## 2024-05-22 VITALS — BP 118/76 | HR 68 | Resp 16

## 2024-05-22 DIAGNOSIS — R21 Rash and other nonspecific skin eruption: Secondary | ICD-10-CM | POA: Diagnosis not present

## 2024-05-22 DIAGNOSIS — T7819XD Other adverse food reactions, not elsewhere classified, subsequent encounter: Secondary | ICD-10-CM | POA: Diagnosis not present

## 2024-05-22 DIAGNOSIS — J309 Allergic rhinitis, unspecified: Secondary | ICD-10-CM | POA: Diagnosis not present

## 2024-05-22 DIAGNOSIS — D8940 Mast cell activation, unspecified: Secondary | ICD-10-CM

## 2024-05-22 DIAGNOSIS — H1010 Acute atopic conjunctivitis, unspecified eye: Secondary | ICD-10-CM

## 2024-05-22 DIAGNOSIS — J4520 Mild intermittent asthma, uncomplicated: Secondary | ICD-10-CM

## 2024-05-22 DIAGNOSIS — K219 Gastro-esophageal reflux disease without esophagitis: Secondary | ICD-10-CM

## 2024-05-22 MED ORDER — ALBUTEROL SULFATE HFA 108 (90 BASE) MCG/ACT IN AERS: INHALATION_SPRAY | RESPIRATORY_TRACT | 3 refills | Status: AC

## 2024-05-22 MED ORDER — CROMOLYN SODIUM 100 MG/5ML PO CONC: 200.0000 mg | Freq: Three times a day (TID) | ORAL | 1 refills | Status: AC

## 2024-05-22 MED ORDER — BUDESONIDE-FORMOTEROL FUMARATE 160-4.5 MCG/ACT IN AERO: 2.0000 | INHALATION_SPRAY | Freq: Two times a day (BID) | RESPIRATORY_TRACT | 1 refills | Status: AC

## 2024-05-22 NOTE — Telephone Encounter (Signed)
 Signed record release has been faxed.

## 2024-05-22 NOTE — Telephone Encounter (Signed)
 Tried calling PCP office Dr Cleotilde and had to leave a voicemail with medical records. Dr Jeneal would like to get a copy of her entire medical record.

## 2024-05-22 NOTE — Progress Notes (Signed)
 Follow-up Note  RE: Ann Christensen MRN: [id number] DOB: 1985/12/30 Date of Office Visit: 05/22/2024   History of present illness: Ann Christensen is a 38 y.o. female presenting today for follow-up of MCAS, recurrent rash, allergic rhinitis with conjunctivitis, asthma, GERD.  She was in the office on 02/21/2024 by myself.  Discussed the use of AI scribe software for clinical note transcription with the patient, who gave verbal consent to proceed.  She experiences persistent symptoms related to mast cell activation syndrome, including post-exertional malaise, dizziness, discoordination, nerve pain, and fatigue. These symptoms have not improved significantly despite dietary changes and medication adjustments. She has been gluten-free since April 11, 2024, after noticing that consuming gluten led to the development of new sores. Despite this dietary change, her sores remain but are no longer open or oozing.  She has a history of using gastrochrom topically as it was helping with healing open skin areas, which she stopped due to application issues as it was 'running everywhere'. She is now planning to use a compounded lotion containing cetaphil, vegetable glycerin, and Nasocrom. Her acne has worsened, which she attributes to hormonal changes. She continues to experience nerve twitching in her face, which has shifted from her left eye to her right eye.  Significant fatigue has severely impacted her ability to engage in activities she previously enjoyed, such as riding horses. She used to ride six to eight horses a day but now struggles to ride one without being bedridden the next day.  She also has a history of chronic diarrhea which has improved with use of dupixent  and cromolyn . Her intestines swell, and she had diarrhea daily until more recently.  She has been on a regimen of dupixent , antihistamines and cromolyn  to manage her symptoms. These medications have provided some relief but  have not fully resolved her symptoms.   She is worried about the potential for hospitalization due to her symptoms and the risk of contracting pneumonia.  As she does not have good protective titers against pneumococcal strains.        Review of systems: 10pt ROS negative unless noted above in HPI   Past medical/social/surgical/family history have been reviewed and are unchanged unless specifically indicated below.  No changes  Medication List: Current Outpatient Medications  Medication Sig Dispense Refill   albuterol  (VENTOLIN  HFA) 108 (90 Base) MCG/ACT inhaler USE 2 INHALATIONS EVERY 4 TO 6 HOURS AS NEEDED FOR WHEEZE OR SHORTNESS OF BREATH 25.5 g 3   buPROPion  (WELLBUTRIN  XL) 150 MG 24 hr tablet Take 150 mg by mouth daily.     cetirizine (ZYRTEC) 10 MG tablet Take 10 mg by mouth 4 (four) times daily.     CHARLOTTE 24 FE 1-20 MG-MCG(24) CHEW Chew 1 tablet by mouth daily.     cromolyn  (GASTROCROM ) 100 MG/5ML solution Take 10 mLs (200 mg total) by mouth 3 (three) times daily before meals. Disregard last rx, requested 90 day 3000 mL 1   cyclobenzaprine  (FLEXERIL ) 10 MG tablet Take 10 mg by mouth as needed for muscle spasms.     dupilumab  (DUPIXENT ) 300 MG/2ML prefilled syringe Inject 600mg  subcutaneous once then 300mg  every 14 days 4 mL 11   EPINEPHrine  0.3 mg/0.3 mL IJ SOAJ injection Inject 0.3 mg into the muscle as needed for anaphylaxis. As needed for life-threatening allergic reactions 2 each 1   famotidine (PEPCID) 20 MG tablet Take 20 mg by mouth 2 (two) times daily.     Spacer/Aero-Holding Raguel FRENCH Use as  directed with inhaler 1 each 0   valACYclovir (VALTREX) 1000 MG tablet Take 1,000 mg by mouth as needed (cold sores outbreak).     budesonide -formoterol  (SYMBICORT ) 160-4.5 MCG/ACT inhaler Inhale 2 puffs into the lungs 2 (two) times daily. Use with spacer device 10.2 g 5   No current facility-administered medications for this visit.     Known medication allergies: Allergies   Allergen Reactions   Bee Venom    Cellulose-Related Compounds Other (See Comments)   Latex Rash    Adhesives      Physical examination: Blood pressure 118/76, pulse 68, resp. rate 16, SpO2 98%.  General: Alert, interactive, in no acute distress. HEENT: PERRLA, TMs pearly gray, turbinates mildly edematous without discharge, post-pharynx non erythematous. Neck: Supple without lymphadenopathy. Lungs: Clear to auscultation without wheezing, rhonchi or rales. {no increased work of breathing. CV: Normal S1, S2 without murmurs. Abdomen: Nondistended, nontender. Skin: B/l legs with hyperpigmented macules without excoriation. Extremities:  No clubbing, cyanosis or edema. Neuro:   Grossly intact.  Diagnostics/Labs: None today  Assessment and plan: MCAS - constellation of symptoms is consistent with MCAS Recurrent Rash Allergies (environmental/food) with OAS Asthma GERD  -Rash has improved and has not had any new lesions appear  - has improved with topical use of cromolyn  -Immune function labs showed strep pneumonia titers with a 30% protective titer rate which is low for an adult.  Would recommend she have a booster vaccine of strep pneumonia.  Would prefer Pneumovax in order to be able to repeat titers afterwards and see if she has mounted an appropriate response.   Schedule shot appt to receive Pnuemovax.  Get titers done no sooner than 6 weeks after vaccination.  -Continue Dupixent  injections every 2 week 1 injection at home administration.  Use abdomen or thighs and rotate your sites.  -Consider patch testing for contact allergy  evaluation.  Contact dermatitis (contact allergy ) Patch testing is the test of choice to evaluate for contact dermatitis.   Patches are placed on Monday in our [place] office with return to office on Wednesday and Friday of same week for readings.   Readings can be done in [place] office.  Once patches are in place to do not get them wet.  You can take  antihistamines while patches are in place.  -Continue avoidance measures for dust mites, molds, grass pollen, tree pollen, weed pollen, rodents, cat dander, dog dander, cockroach.  -Stinging insect panel was positive to  white faced hornet, yellow hornet, honeybee, yellowjacket, paper wasp as well as fire  ant IgE.   Continue avoidance as much as possible and have access to Epipen   0.3mg  device in case of allergic reaction and follow emergency action plan.  -Recommend use of Cetirizine 10mg  take 2-4 tabs daily -Use Ryaltris nasal spray 2 sprays each nostril twice a day for nasal congestion or drainage.  This is a combination spray with nasal steroid for congestion control and nasal antihistamine for drainage control.  With using nasal sprays point tip of bottle toward eye on same side nostril and lean head slightly forward for best technique.   - Consider allergy  shots as a means of long-term control.  Allergy  shots re-train and reset the immune system to ignore environmental allergens and decrease the resulting immune response to those allergens (sneezing, itchy watery eyes, runny nose, nasal congestion, etc).   Allergy  shots improve symptoms in 75-85% of patients.  -Continue Gastrocrom  increased to 200mg  prior to meals and if needed can take additional 4th dose  for symptoms -Have access to albuterol  inhaler 2 puffs every 4-6 hours as needed for cough/wheeze/shortness of breath/chest tightness.  May use 15-20 minutes prior to activity.   Monitor frequency of use.   -Use Symbicort  160mcg 2 puffs twice a day with spacer device during respiratory illnesses.  -Continue Omeprazole and Famotidine as needed. - Will look into getting Ayvakit approved which is the medication for systemic mastocytosis that also should be effective for mast cell activation spectrum   The oral allergy  syndrome (OAS) or pollen-food allergy  syndrome (PFAS) is a relatively common form of food allergy , particularly in adults. It  typically occurs in people who have pollen allergies when the immune system sees proteins on the food that look like proteins on the pollen. This results in the allergy  antibody (IgE) binding to the food instead of the pollen. Patients typically report itching and/or mild swelling of the mouth and throat immediately following ingestion of certain uncooked fruits (including nuts) or raw vegetables. Only a very small number of affected individuals experience systemic allergic reactions, such as anaphylaxis which occurs with true food allergies.    Follow-up in 6 months or sooner if needed  I appreciate the opportunity to take part in Daylah's care. Please do not hesitate to contact me with questions.  Sincerely,   Danita Brain, MD Allergy /Immunology [facility]

## 2024-05-22 NOTE — Patient Instructions (Addendum)
 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  Would recommend she have a booster vaccine of strep pneumonia.  Would prefer Pneumovax in order to be able to repeat titers afterwards and see if she has mounted an appropriate response.   Schedule shot appt to receive Pnuemovax.  Get titers done no sooner than 6 weeks after vaccination.  -Continue Dupixent  injections every 2 week 1 injection at home administration.  Use abdomen or thighs and rotate your sites.  -Consider patch testing for contact allergy  evaluation.  Contact dermatitis (contact allergy ) Patch testing is the test of choice to evaluate for contact dermatitis.   Patches are placed on Monday in our [place] office with return to office on Wednesday and Friday of same week for readings.   Readings can be done in [place] office.  Once patches are in place to do not get them wet.  You can take antihistamines while patches are in place.  -Continue avoidance measures for dust mites, molds, grass pollen, tree pollen, weed pollen, rodents, cat dander, dog dander, cockroach.  -Stinging insect panel was positive to  white faced hornet, yellow hornet, honeybee, yellowjacket, paper wasp as well as fire  ant IgE.   Continue avoidance as much as possible and have access to Epipen   0.3mg  device in case of allergic reaction and follow emergency action plan.  -Recommend use of Cetirizine 10mg  take 2-4 tabs daily -Use Ryaltris nasal spray 2 sprays each nostril twice a day for nasal congestion or drainage.  This is a combination spray with nasal steroid for congestion control and nasal antihistamine for drainage control.  With using nasal sprays point tip of bottle toward  eye on same side nostril and lean head slightly forward for best technique.   - Consider allergy  shots as a means of long-term control.  Allergy  shots re-train and reset the immune system to ignore environmental allergens and decrease the resulting immune response to those allergens (sneezing, itchy watery eyes, runny nose, nasal congestion, etc).   Allergy  shots improve symptoms in 75-85% of patients.  -Continue Gastrocrom  increased to 200mg  prior to meals and if needed can take additional 4th dose for symptoms -Have access to albuterol  inhaler 2 puffs every 4-6 hours as needed for cough/wheeze/shortness of breath/chest tightness.  May use 15-20 minutes prior to activity.   Monitor frequency of use.   -Use Symbicort  160mcg 2 puffs twice a day with spacer device during respiratory illnesses.  -Continue Omeprazole and Famotidine as needed. - Will look into getting Ayvakit approved which is the medication for systemic mastocytosis that also should be effective for mast cell activation spectrum   The oral allergy  syndrome (OAS) or pollen-food allergy  syndrome (PFAS) is a relatively common form of food allergy , particularly in adults. It typically occurs in people who have pollen allergies when the immune system sees proteins on the food that look like proteins on the pollen. This results in the allergy  antibody (IgE) binding to the food instead of the pollen. Patients typically report itching and/or mild swelling of the mouth and throat immediately following ingestion of certain uncooked fruits (including nuts) or raw vegetables. Only a very small number of affected individuals experience systemic allergic reactions, such as anaphylaxis which  occurs with true food allergies.       Follow-up in 6 months or sooner if needed

## 2024-05-22 NOTE — Addendum Note (Signed)
 Addended by: PHILIPPA CLOTILDA SAILOR on: 05/22/2024 03:58 PM   Modules accepted: Orders

## 2024-06-20 NOTE — Progress Notes (Signed)
 Office Visit Note  Patient: Ann Christensen             Date of Birth: [birth year]           MRN: [id number]             PCP: Cleotilde, [place]  E, PA Referring: Jeneal Danita Macintosh, MD Visit Date: 07/04/2024 Occupation: Data Unavailable  Subjective:  Generalized pain  History of Present Illness: Ann Christensen is a 38 y.o. female seen for the evaluation of generalized pain and discomfort.  According the patient her symptoms started in July 2024.  She states she was under a lot of stress and then she had a wasp sting to which she reacted.  Soon after she developed upper respiratory viral infection.  She states the COVID-19 test was negative.  She started developing rash over her trunk and extremities.  She states sores were so bad that they were bleeding.  She states the sores were pruritic.  She was initially diagnosed with shingles but she did not respond to any of the medications for shingles.  She states she also started experiencing fatigue, brain fog, decreased grip strength and was falling.  She was experiencing joint and muscle pain.  She states that she decided to discontinue all her medications in November 2024 and her symptoms somewhat got better.  She restarted her Wellbutrin  in January 2025.  She was evaluated by her PCP due to ongoing symptoms and was referred to an allergist.  She states she was also referred to a dermatologist.  She had a biopsy which was positive for eczema.  She states the allergist diagnosed her with the MCAS and started her on Dupixent  and Gastrocrom .  Her symptoms gradually improved.  She noticed improvement in her muscle spasm and discomfort.  She was also doing dry needling.  She states despite having response to the medication she continues to have water retention and inflammatory feeling all over her body.  She notices large lymph nodes.  She recalls being on prednisone in December 2024 only for 6 days but had reaction to it.  She states has been  using compression socks.  She has occasional discomfort in her neck.  She has ongoing discomfort in her lateral epicondyle region.  Her main concern is fluid retention, hyperalgesia, shooting pain.  She continues to have fatigue.  She can lanes of heat intolerance, vertigo, dizziness, orthostatic intolerance, postexertional malaise, exercise intolerance, brain fog, headaches, nerve pain in her legs and face.  She notices decreased grip strength she complains of blood pooling in her hands and feet and chilblains in her hands.  She states her insomnia is better with medications. There is family history of ankylosing spondylitis in her mother, Yvone' disease and 2 maternal aunts, rheumatoid arthritis and her first cousin and her cousin's daughter. 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  She is married, gravida 0.  There is no history of DVTs.  She drinks alcohol rarely and never been a smoker.    Activities of Daily Living:  Patient reports morning stiffness for 1 hour.   Patient Reports nocturnal pain.  Difficulty dressing/grooming: Denies Difficulty climbing stairs: Reports Difficulty getting out of chair: Reports Difficulty using hands for taps, buttons, cutlery, and/or writing: Reports  Review of Systems  Constitutional:  Positive for fatigue.  HENT:  Negative for mouth sores and mouth dryness.   Eyes:  Negative for dryness.  Respiratory:  Negative for shortness of breath.   Cardiovascular:  Positive for palpitations. Negative for chest pain.  Gastrointestinal:  Negative for blood in stool, constipation and diarrhea.  Endocrine: Positive for increased urination.  Genitourinary:  Negative for involuntary urination.  Musculoskeletal:  Positive for joint pain, gait problem, joint pain, joint swelling, myalgias, muscle weakness, morning stiffness, muscle tenderness and myalgias.  Skin:  Positive for rash and sensitivity to sunlight. Negative for color change and hair loss.   Allergic/Immunologic: Negative for susceptible to infections.  Neurological:  Positive for dizziness and headaches.  Hematological:  Positive for swollen glands.  Psychiatric/Behavioral:  Positive for sleep disturbance. Negative for depressed mood. The patient is nervous/anxious.     PMFS History:  Patient Active Problem List   Diagnosis Date Noted   Long term current use of therapeutic drug 05/14/2024   Anxiety disorder    Chronic pain syndrome    Depression    Dizziness    Environmental allergies    Hormone disorder    Other fatigue    PAC (premature atrial contraction)    Palpitations    Panic disorder    PCOS (polycystic ovarian syndrome)    Pharyngoesophageal dysphagia    PTSD (post-traumatic stress disorder)    Scoliosis    Skin rash    Acne vulgaris 05/17/2019   Multiple food allergies 07/05/2018   Controlled substance agreement signed 01/03/2018   Health care maintenance 12/08/2016   ADHD (attention deficit hyperactivity disorder) 09/09/2016   Insomnia 09/09/2016   Depression, recurrent 09/09/2016   Anxiety 09/09/2016   Asthma 09/28/2007   GERD 09/28/2007   Diaphragmatic hernia 09/28/2007   IRRITABLE BOWEL SYNDROME 09/28/2007    Past Medical History:  Diagnosis Date   Acne vulgaris 05/17/2019   ADHD (attention deficit hyperactivity disorder) 09/09/2016   Anxiety    Anxiety disorder    Asthma    Chronic pain syndrome    Controlled substance agreement signed 01/03/2018   Depression    Depression, recurrent 09/09/2016   Diaphragmatic hernia 09/28/2007   Qualifier: Diagnosis of   By: Vernell LPN, Barnie SCULL SNOMED Dx Update Oct 2024     Dizziness    Environmental allergies    GERD 09/28/2007   Qualifier: Diagnosis of   By: Vernell LPN, Deborah         Health care maintenance 12/08/2016   Hormone disorder    Insomnia 09/09/2016   IRRITABLE BOWEL SYNDROME 09/28/2007   Qualifier: Diagnosis of   By: Vernell LPN, Deborah         Multiple food allergies  07/05/2018   Other fatigue    PAC (premature atrial contraction)    Palpitations    Panic disorder    PCOS (polycystic ovarian syndrome)    Pharyngoesophageal dysphagia    PTSD (post-traumatic stress disorder)    Scoliosis    Skin rash     Family History  Problem Relation Age of Onset   Hypertension Mother    Breast cancer Mother 33       enviromental    Other Mother        tumors on kidney and adrenal gland   Ankylosing spondylitis Mother    Alcohol abuse Father    Hypertension Father    Diabetes Father        pre-diabetic   Sinusitis Sister    Atrial fibrillation Sister    Polycystic ovary syndrome Sister    Atrial fibrillation Brother    Thyroid disease Maternal Aunt    Graves' disease Maternal Aunt  Graves' disease Maternal Aunt    Colon cancer Maternal Aunt    Polycystic ovary syndrome Paternal Aunt    Polycystic ovary syndrome Paternal Aunt    Polycystic ovary syndrome Paternal Aunt    Polycystic ovary syndrome Paternal Aunt    Cancer Maternal Grandmother        colon   Diabetes Paternal Grandmother    Rheum arthritis Cousin    Rheum arthritis Cousin    Asthma Neg Hx    Past Surgical History:  Procedure Laterality Date   TONSILLECTOMY AND ADENOIDECTOMY     She was 38 years old   WISDOM TOOTH EXTRACTION  2011   Social History   Tobacco Use   Smoking status: Never    Passive exposure: Never   Smokeless tobacco: Never  Vaping Use   Vaping status: Never Used  Substance Use Topics   Alcohol use: Yes    Comment: social   Drug use: Not Currently    Types: Marijuana    Comment: pain   Social History   Social History Narrative   Not on file     Immunization History  Administered Date(s) Administered   HPV 9-valent 01/31/2020   Tdap 04/05/2013     Objective: Vital Signs: BP 120/79 (BP Location: Right Arm, Patient Position: Sitting, Cuff Size: Normal)   Pulse 69   Temp 98.3 F (36.8 C)   Resp 17   Ht 5' 4 (1.626 m)   Wt 199 lb 3.2 oz  (90.4 kg)   BMI 34.19 kg/m    Physical Exam Vitals and nursing note reviewed.  Constitutional:      Appearance: She is well-developed.  HENT:     Head: Normocephalic and atraumatic.  Eyes:     Conjunctiva/sclera: Conjunctivae normal.  Cardiovascular:     Rate and Rhythm: Normal rate and regular rhythm.     Heart sounds: Normal heart sounds.  Pulmonary:     Effort: Pulmonary effort is normal.     Breath sounds: Normal breath sounds.  Abdominal:     General: Bowel sounds are normal.     Palpations: Abdomen is soft.  Musculoskeletal:     Cervical back: Normal range of motion.  Skin:    General: Skin is warm and dry.     Capillary Refill: Capillary refill takes less than 2 seconds.  Neurological:     Mental Status: She is alert and oriented to person, place, and time.  Psychiatric:        Behavior: Behavior normal.      Musculoskeletal Exam: Cervical, thoracic and lumbar spine were in good range of motion.  There was no SI joint tenderness.  Shoulder joints, elbow joints, wrist joints, MCPs, PIPs and DIPs were in good range of motion with no synovitis.  She had tenderness over bilateral lateral epicondyle region.  Hip joints and knee joints were in good range of motion without any warmth swelling or effusion.  There was no tenderness over ankles or MTPs.  She had hypermobility in most of her joints.  Generalized hyperalgesia and positive tender points were noted.   CDAI Exam: CDAI Score: -- Patient Global: --; Provider Global: -- Swollen: --; Tender: -- Joint Exam 07/04/2024   No joint exam has been documented for this visit   There is currently no information documented on the homunculus. Go to the Rheumatology activity and complete the homunculus joint exam.  Investigation: No additional findings.  Imaging: No results found.  Recent Labs: Lab Results  Component  Value Date   WBC 5.8 01/31/2020   HGB 12.7 01/31/2020   PLT 285 01/31/2020   NA 139 01/31/2020   K  4.2 01/31/2020   CL 103 01/31/2020   CO2 25 01/31/2020   GLUCOSE 90 01/31/2020   BUN 11 01/31/2020   CREATININE 0.86 01/31/2020   BILITOT 0.5 01/31/2020   ALKPHOS 53 01/31/2020   AST 22 01/31/2020   ALT 19 01/31/2020   PROT 6.7 01/31/2020   ALBUMIN 4.4 01/31/2020   CALCIUM 9.4 01/31/2020   GFRAA 102 01/31/2020   June 20, 2023 ANA negative, CRP less than 1 rheumatoid factor less than 10 TSH normal vitamin D 25.5, B12 483.,  CBC WBC 8.7, hemoglobin 13.6, platelets 359 Speciality Comments: No specialty comments available.  Procedures:  No procedures performed Allergies: Bee venom, Cellulose-related compounds, and Latex   Assessment / Plan:     Visit Diagnoses: Rash-she has been having pruritic papular rash on her extremities since she had last bite in July 2024.  She states she had extensive workup by the dermatologist and given a biopsy which was inconclusive.  She states her symptoms started improving once she started gluten-free diet.  She still had few papular lesions with excoriation marks.  Polyarthralgia-she complains of pain and discomfort in multiple joints.  She states pain has been almost all of her life.  The pain has been worse since she had viral infection September 2024.  She had workup in December 2024 which showed ANA negative, CRP normal, rheumatoid factor negative.  CBC was normal.  B12 normal.  Labs were reviewed with the patient on her iPhone.  No synovitis was noted on the examination today.  All of her joints were in full range of motion.  No further autoimmune workup is needed.  Myalgia-she complains of pain in all of her muscles.  She had no muscular weakness or tenderness.  She had no difficulty getting up from the squatting position.  Other fatigue-she has been experiencing increased fatigue since she had the viral infection in September 2024.  She was evaluated by cardiologist per patient.  She states she was told that she had generalized deconditioning.  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  She states she gets tired very easily.  She will follow-up with the cardiologist.  Other idiopathic scoliosis, lumbar region  Hypermobility of joint-hypermobility was noted in most of her joints.  Patient states she had hypermobility all her life.  She was able to reach the floor with the palm of her hands.  Increased association of hypermobility with fibromyalgia and early occurrence of osteoarthritis was discussed.  Joint protection muscle strengthening was discussed.  Fibromyalgia syndrome-patient states she was diagnosed with fibromyalgia syndrome many years ago.  She continues to have hyperalgesia, shooting pain, and generalized pain.  Benefits of water aerobics, swimming and stretching were discussed.  I will refer her to integrative therapies.  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  She states since she started Dupixent  and Gastrocrom  her symptoms have improved.  Gluten sensitivity-she is also being on gluten-free diet and has noticed improvement in her symptoms.  Other medical problems are listed as follows:  Chronic pain syndrome  Acne vulgaris  Gastroesophageal reflux disease without esophagitis  Diaphragmatic hernia without obstruction and without gangrene  Other irritable bowel syndrome-patient states she has diarrhea alternating with constipation.  Vitamin D deficiency-has been taking vitamin D 5000 units daily for a long  time.  Her vitamin D was low at 25.5 in December 2024.  I advised her to have repeat vitamin D with her PCP.  She may have to adjust the dose of vitamin D.  Attention deficit hyperactivity disorder (ADHD), combined type  Mild intermittent asthma without complication  Environmental allergies  Other insomnia  Panic disorder  Depression, recurrent  Anxiety  PTSD (post-traumatic stress disorder)  PCOS (polycystic ovarian  syndrome)  Orders: No orders of the defined types were placed in this encounter.  No orders of the defined types were placed in this encounter.   Face-to-face time spent with patient was over 45 minutes. Greater than 50% of time was spent in counseling and coordination of care.  Follow-Up Instructions: Return if symptoms worsen or fail to improve, for polyarthralgia.   Maya Nash, MD  Note - This record has been created using Animal nutritionist.  Chart creation errors have been sought, but may not always  have been located. Such creation errors do not reflect on  the standard of medical care.

## 2024-07-04 ENCOUNTER — Ambulatory Visit: Admitting: Rheumatology

## 2024-07-04 ENCOUNTER — Encounter: Payer: Self-pay | Admitting: Rheumatology

## 2024-07-04 VITALS — BP 120/79 | HR 69 | Temp 98.3°F | Resp 17 | Ht 64.0 in | Wt 199.2 lb

## 2024-07-04 DIAGNOSIS — G894 Chronic pain syndrome: Secondary | ICD-10-CM | POA: Diagnosis not present

## 2024-07-04 DIAGNOSIS — F419 Anxiety disorder, unspecified: Secondary | ICD-10-CM

## 2024-07-04 DIAGNOSIS — K588 Other irritable bowel syndrome: Secondary | ICD-10-CM

## 2024-07-04 DIAGNOSIS — G4709 Other insomnia: Secondary | ICD-10-CM

## 2024-07-04 DIAGNOSIS — M2550 Pain in unspecified joint: Secondary | ICD-10-CM | POA: Diagnosis not present

## 2024-07-04 DIAGNOSIS — M797 Fibromyalgia: Secondary | ICD-10-CM

## 2024-07-04 DIAGNOSIS — K449 Diaphragmatic hernia without obstruction or gangrene: Secondary | ICD-10-CM

## 2024-07-04 DIAGNOSIS — E282 Polycystic ovarian syndrome: Secondary | ICD-10-CM

## 2024-07-04 DIAGNOSIS — M4126 Other idiopathic scoliosis, lumbar region: Secondary | ICD-10-CM | POA: Diagnosis not present

## 2024-07-04 DIAGNOSIS — L700 Acne vulgaris: Secondary | ICD-10-CM | POA: Diagnosis not present

## 2024-07-04 DIAGNOSIS — Z9109 Other allergy status, other than to drugs and biological substances: Secondary | ICD-10-CM

## 2024-07-04 DIAGNOSIS — R21 Rash and other nonspecific skin eruption: Secondary | ICD-10-CM

## 2024-07-04 DIAGNOSIS — J4520 Mild intermittent asthma, uncomplicated: Secondary | ICD-10-CM

## 2024-07-04 DIAGNOSIS — M249 Joint derangement, unspecified: Secondary | ICD-10-CM

## 2024-07-04 DIAGNOSIS — E559 Vitamin D deficiency, unspecified: Secondary | ICD-10-CM

## 2024-07-04 DIAGNOSIS — F4310 Post-traumatic stress disorder, unspecified: Secondary | ICD-10-CM

## 2024-07-04 DIAGNOSIS — K9041 Non-celiac gluten sensitivity: Secondary | ICD-10-CM

## 2024-07-04 DIAGNOSIS — D8940 Mast cell activation, unspecified: Secondary | ICD-10-CM | POA: Diagnosis not present

## 2024-07-04 DIAGNOSIS — F902 Attention-deficit hyperactivity disorder, combined type: Secondary | ICD-10-CM

## 2024-07-04 DIAGNOSIS — K219 Gastro-esophageal reflux disease without esophagitis: Secondary | ICD-10-CM

## 2024-07-04 DIAGNOSIS — M791 Myalgia, unspecified site: Secondary | ICD-10-CM

## 2024-07-04 DIAGNOSIS — R5383 Other fatigue: Secondary | ICD-10-CM | POA: Diagnosis not present

## 2024-07-04 DIAGNOSIS — F339 Major depressive disorder, recurrent, unspecified: Secondary | ICD-10-CM

## 2024-07-04 DIAGNOSIS — F410 Panic disorder [episodic paroxysmal anxiety] without agoraphobia: Secondary | ICD-10-CM

## 2024-07-04 NOTE — Addendum Note (Signed)
 Addended by: YVONE SENSOR C on: 07/04/2024 11:30 AM   Modules accepted: Orders

## 2024-08-02 ENCOUNTER — Ambulatory Visit: Admitting: Rheumatology

## 2024-10-15 ENCOUNTER — Ambulatory Visit: Admitting: Neurology

## 2024-12-04 ENCOUNTER — Ambulatory Visit: Admitting: Allergy
